# Patient Record
Sex: Female | Born: 1990
Health system: Southern US, Community
[De-identification: ages and names within clinical notes are randomized; demographics above are authoritative.]

## PROBLEM LIST (undated history)

## (undated) ENCOUNTER — Inpatient Hospital Stay (HOSPITAL_COMMUNITY): Payer: Self-pay

## (undated) DIAGNOSIS — F32A Depression, unspecified: Secondary | ICD-10-CM

## (undated) DIAGNOSIS — O039 Complete or unspecified spontaneous abortion without complication: Secondary | ICD-10-CM

## (undated) HISTORY — DX: Depression, unspecified: F32.A

## (undated) HISTORY — PX: OTHER SURGICAL HISTORY: SHX169

---
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
## 2008-07-05 ENCOUNTER — Emergency Department (HOSPITAL_COMMUNITY): Admission: EM | Admit: 2008-07-05 | Discharge: 2008-07-06 | Payer: Self-pay | Admitting: Emergency Medicine

## 2010-01-12 ENCOUNTER — Inpatient Hospital Stay (HOSPITAL_COMMUNITY): Admission: AD | Admit: 2010-01-12 | Discharge: 2010-01-12 | Payer: Self-pay | Admitting: Obstetrics and Gynecology

## 2010-01-12 DIAGNOSIS — O368190 Decreased fetal movements, unspecified trimester, not applicable or unspecified: Secondary | ICD-10-CM

## 2010-02-12 ENCOUNTER — Inpatient Hospital Stay (HOSPITAL_COMMUNITY): Admission: AD | Admit: 2010-02-12 | Discharge: 2010-02-12 | Payer: Self-pay | Admitting: Obstetrics & Gynecology

## 2010-02-13 ENCOUNTER — Inpatient Hospital Stay (HOSPITAL_COMMUNITY): Admission: AD | Admit: 2010-02-13 | Discharge: 2010-02-15 | Payer: Self-pay | Admitting: Obstetrics and Gynecology

## 2010-04-28 ENCOUNTER — Ambulatory Visit (HOSPITAL_COMMUNITY)
Admission: RE | Admit: 2010-04-28 | Discharge: 2010-04-28 | Disposition: A | Discharge status: Home / Self Care | Payer: 59 | Source: Ambulatory Visit | Attending: Obstetrics and Gynecology | Admitting: Obstetrics and Gynecology

## 2010-04-28 DIAGNOSIS — O923 Agalactia: Secondary | ICD-10-CM | POA: Insufficient documentation

## 2010-05-30 LAB — CBC
HCT: 28.2 % — ABNORMAL LOW (ref 36.0–46.0)
HCT: 33.5 % — ABNORMAL LOW (ref 36.0–46.0)
MCH: 29.4 pg (ref 26.0–34.0)
MCHC: 33.3 g/dL (ref 30.0–36.0)
MCV: 85.4 fL (ref 78.0–100.0)
MCV: 85.8 fL (ref 78.0–100.0)
Platelets: 285 10*3/uL (ref 150–400)
RBC: 3.29 MIL/uL — ABNORMAL LOW (ref 3.87–5.11)
RDW: 13.8 % (ref 11.5–15.5)
WBC: 12.4 10*3/uL — ABNORMAL HIGH (ref 4.0–10.5)

## 2010-06-28 LAB — CBC
Hemoglobin: 13.4 g/dL (ref 12.0–15.0)
MCHC: 34.4 g/dL (ref 30.0–36.0)
MCV: 86.6 fL (ref 78.0–100.0)
RDW: 12.6 % (ref 11.5–15.5)

## 2010-06-28 LAB — DIFFERENTIAL
Basophils Absolute: 0 10*3/uL (ref 0.0–0.1)
Basophils Relative: 1 % (ref 0–1)
Eosinophils Absolute: 0.2 10*3/uL (ref 0.0–0.7)
Eosinophils Relative: 2 % (ref 0–5)
Monocytes Absolute: 1.1 10*3/uL — ABNORMAL HIGH (ref 0.1–1.0)
Neutro Abs: 5.4 10*3/uL (ref 1.7–7.7)

## 2010-06-28 LAB — POCT I-STAT, CHEM 8
BUN: 7 mg/dL (ref 6–23)
Calcium, Ion: 1.3 mmol/L (ref 1.12–1.32)
Creatinine, Ser: 0.8 mg/dL (ref 0.4–1.2)
Glucose, Bld: 97 mg/dL (ref 70–99)
Hemoglobin: 14.3 g/dL (ref 12.0–15.0)
Sodium: 141 mEq/L (ref 135–145)
TCO2: 27 mmol/L (ref 0–100)

## 2011-03-31 ENCOUNTER — Encounter (HOSPITAL_COMMUNITY): Payer: Self-pay

## 2011-03-31 ENCOUNTER — Inpatient Hospital Stay (HOSPITAL_COMMUNITY)
Admission: AD | Admit: 2011-03-31 | Discharge: 2011-03-31 | Disposition: A | Discharge status: Home / Self Care | Payer: Medicaid Other | Source: Ambulatory Visit | Attending: Obstetrics & Gynecology | Admitting: Obstetrics & Gynecology

## 2011-03-31 DIAGNOSIS — R109 Unspecified abdominal pain: Secondary | ICD-10-CM | POA: Insufficient documentation

## 2011-03-31 DIAGNOSIS — O99891 Other specified diseases and conditions complicating pregnancy: Secondary | ICD-10-CM | POA: Insufficient documentation

## 2011-03-31 DIAGNOSIS — T148XXA Other injury of unspecified body region, initial encounter: Secondary | ICD-10-CM

## 2011-03-31 DIAGNOSIS — R10817 Generalized abdominal tenderness: Secondary | ICD-10-CM

## 2011-03-31 LAB — URINALYSIS, ROUTINE W REFLEX MICROSCOPIC
Bilirubin Urine: NEGATIVE
Glucose, UA: NEGATIVE mg/dL
Hgb urine dipstick: NEGATIVE
Protein, ur: NEGATIVE mg/dL
Urobilinogen, UA: 2 mg/dL — ABNORMAL HIGH (ref 0.0–1.0)

## 2011-03-31 NOTE — ED Provider Notes (Signed)
History   Pt presents today c/o lower abd pain that began immediately after she picked up her 30lbs son. She states the pain felt like it encompassed the entire lower abd but has progressively improved. She denies vag dc, bleeding, fever, or any other sx at this time.   Chief Complaint  Patient presents with  . Abdominal Pain   HPI  OB History    Grav Para Term Preterm Abortions TAB SAB Ect Mult Living   2 1 1  0 0 0 0 0 0 1      Past Medical History  Diagnosis Date  . Asthma     Past Surgical History  Procedure Date  . No past surgeries     No family history on file.  History  Substance Use Topics  . Smoking status: Never Smoker   . Smokeless tobacco: Not on file  . Alcohol Use: No    Allergies: Allergies not on file  No prescriptions prior to admission    Review of Systems  Constitutional: Negative for fever.  Eyes: Negative for blurred vision and double vision.  Cardiovascular: Negative for chest pain and palpitations.  Gastrointestinal: Positive for abdominal pain. Negative for nausea, vomiting, diarrhea and constipation.  Genitourinary: Negative for dysuria, urgency and frequency.  Neurological: Negative for dizziness and headaches.  Psychiatric/Behavioral: Negative for depression and suicidal ideas.   Physical Exam   Blood pressure 105/53, pulse 90, temperature 98.8 F (37.1 C), temperature source Oral, resp. rate 16, height 5\' 4"  (1.626 m), weight 161 lb (73.029 kg).  Physical Exam  Nursing note and vitals reviewed. Constitutional: She is oriented to person, place, and time. She appears well-developed and well-nourished. No distress.  HENT:  Head: Normocephalic and atraumatic.  Eyes: EOM are normal. Pupils are equal, round, and reactive to light.  GI: Soft. She exhibits no distension and no mass. There is no tenderness. There is no rebound and no guarding.  Genitourinary: No bleeding around the vagina. No vaginal discharge found.       Cervix  Lg/closed. FHTs 135 by doppler.  Neurological: She is alert and oriented to person, place, and time.  Skin: Skin is warm and dry. She is not diaphoretic.  Psychiatric: She has a normal mood and affect. Her behavior is normal. Judgment and thought content normal.    MAU Course  Procedures  UA pending.  Assessment and Plan  Abd pain: pt pain now improving. Most likely muscle strain from lifting her son. Will call pt is ua is positive for UTI. Otherwise, pt is to f/u with OB provider of her choice. Discussed diet, activity, risks, and precautions.  Clinton Gallant. Amali Uhls III, DrHSc, MPAS, PA-C  03/31/2011, 3:19 PM   Henrietta Hoover, PA 03/31/11 1529

## 2011-03-31 NOTE — Progress Notes (Signed)
Patient presents with severe lower abdominal pain after picking up her one year old, has gotten worse, no vaginal bleeding.

## 2011-04-26 ENCOUNTER — Encounter (HOSPITAL_COMMUNITY): Payer: Self-pay

## 2011-04-26 ENCOUNTER — Inpatient Hospital Stay (HOSPITAL_COMMUNITY)
Admission: AD | Admit: 2011-04-26 | Discharge: 2011-04-26 | Disposition: A | Discharge status: Home / Self Care | Payer: Medicaid Other | Source: Ambulatory Visit | Attending: Obstetrics & Gynecology | Admitting: Obstetrics & Gynecology

## 2011-04-26 DIAGNOSIS — G44209 Tension-type headache, unspecified, not intractable: Secondary | ICD-10-CM | POA: Insufficient documentation

## 2011-04-26 DIAGNOSIS — N949 Unspecified condition associated with female genital organs and menstrual cycle: Secondary | ICD-10-CM

## 2011-04-26 DIAGNOSIS — R109 Unspecified abdominal pain: Secondary | ICD-10-CM | POA: Insufficient documentation

## 2011-04-26 DIAGNOSIS — O99891 Other specified diseases and conditions complicating pregnancy: Secondary | ICD-10-CM | POA: Insufficient documentation

## 2011-04-26 LAB — URINALYSIS, ROUTINE W REFLEX MICROSCOPIC
Bilirubin Urine: NEGATIVE
Glucose, UA: NEGATIVE mg/dL
Ketones, ur: 40 mg/dL — AB
Leukocytes, UA: NEGATIVE
Nitrite: NEGATIVE
Specific Gravity, Urine: 1.015 (ref 1.005–1.030)
pH: 7 (ref 5.0–8.0)

## 2011-04-26 LAB — WET PREP, GENITAL
Clue Cells Wet Prep HPF POC: NONE SEEN
Trich, Wet Prep: NONE SEEN

## 2011-04-26 MED ORDER — PRENATAL RX 60-1 MG PO TABS: 1.0000 | ORAL_TABLET | Freq: Every day | ORAL | Status: DC

## 2011-04-26 NOTE — ED Provider Notes (Signed)
Chief Complaint  Patient presents with  . Abdominal Pain    S: Erin [name] is a 21 y.o. G2P1001 at unknown GA with LMP "November" presenting with 3 day hx of sharp crampy pain in lower abdomen and groin. The pain is exacerbated by walking and changing positions. No dysuria, urgency or frequency. She denies contractions, vaginal bleeding or leakage of fluid. Fetus is active. Having frequent moderate frontal headaches, not treated.  She intends to have a mid trimester pregnancy termination at [facility] in [place] when her Medicaid comes through. 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   ROS: Negative except as noted above.  O: Filed Vitals:   04/26/11 1224  BP: 110/73  Pulse: 104  Temp: 98.9 F (37.2 C)  Resp: 16    Gen: NAD Abd: soft, mildly tender in lower abd and groin. Fundus at [id number] Pelvic: NEFG, BUS neg             Spec: thick white discharge             Cx: L/C/H  Uterus: 16 wk size DT FHR 140 UCs: none  Results for orders placed during the hospital encounter of 04/26/11 (from the past 24 hour(s))  URINALYSIS, ROUTINE W REFLEX MICROSCOPIC     Status: Abnormal   Collection Time   04/26/11 12:35 PM      Component Value Range   Color, Urine YELLOW  YELLOW    APPearance HAZY (*) CLEAR    Specific Gravity, Urine 1.015  1.005 - 1.030    pH 7.0  5.0 - 8.0    Glucose, UA NEGATIVE  NEGATIVE (mg/dL)   Hgb urine dipstick NEGATIVE  NEGATIVE    Bilirubin Urine NEGATIVE  NEGATIVE    Ketones, ur 40 (*) NEGATIVE (mg/dL)   Protein, ur NEGATIVE  NEGATIVE (mg/dL)   Urobilinogen, UA 2.0 (*) 0.0 - 1.0 (mg/dL)   Nitrite NEGATIVE  NEGATIVE    Leukocytes, UA NEGATIVE  NEGATIVE    Results for orders placed during the hospital encounter of 04/26/11 (from the past 24 hour(s))  URINALYSIS, ROUTINE W REFLEX MICROSCOPIC     Status: Abnormal   Collection Time   04/26/11 12:35 PM      Component Value Range   Color, Urine YELLOW  YELLOW    APPearance HAZY (*) CLEAR    Specific  Gravity, Urine 1.015  1.005 - 1.030    pH 7.0  5.0 - 8.0    Glucose, UA NEGATIVE  NEGATIVE (mg/dL)   Hgb urine dipstick NEGATIVE  NEGATIVE    Bilirubin Urine NEGATIVE  NEGATIVE    Ketones, ur 40 (*) NEGATIVE (mg/dL)   Protein, ur NEGATIVE  NEGATIVE (mg/dL)   Urobilinogen, UA 2.0 (*) 0.0 - 1.0 (mg/dL)   Nitrite NEGATIVE  NEGATIVE    Leukocytes, UA NEGATIVE  NEGATIVE   WET PREP, GENITAL     Status: Abnormal   Collection Time   04/26/11  1:00 PM      Component Value Range   Yeast Wet Prep HPF POC NONE SEEN  NONE SEEN    Trich, Wet Prep NONE SEEN  NONE SEEN    Clue Cells Wet Prep HPF POC NONE SEEN  NONE SEEN    WBC, Wet Prep HPF POC FEW (*) NONE SEEN    A: Round Ligament Pain @ 16-[redacted] wk GA Tension H/A  P: GC/CT sent.  Tylenol for H/A  Reassurance given and general relief measures reviewed: avoidance of precipitating movements,  instructions on abdominal tightening/pelvic rock exercises, abdominal binder, rest with hip flexion. PNVs given in case she opts to continue the pregnancy and go to [facility].

## 2011-04-26 NOTE — Progress Notes (Signed)
Patient states that since the first of the week she has been having headaches, low back pain and lower abdominal cramping. Has had no prenatal care and is unsure if she is going to keep this pregnancy. Obtained FHR in triage. Denies any bleeding or unusual discharge.

## 2011-04-26 NOTE — ED Provider Notes (Signed)
Attestation of Attending Supervision of Advanced Practitioner: Evaluation and management procedures were performed by the PA/NP/CNM/OB Fellow under my supervision/collaboration. Chart reviewed, and agree with management and plan.  Suriah Peragine, M.D. 04/26/2011 2:09 PM   

## 2011-04-27 LAB — GC/CHLAMYDIA PROBE AMP, GENITAL: Chlamydia, DNA Probe: NEGATIVE

## 2011-06-20 ENCOUNTER — Encounter (HOSPITAL_COMMUNITY): Payer: Self-pay | Admitting: *Deleted

## 2011-06-20 ENCOUNTER — Inpatient Hospital Stay (HOSPITAL_COMMUNITY)
Admission: AD | Admit: 2011-06-20 | Discharge: 2011-06-20 | Disposition: A | Discharge status: Home / Self Care | Payer: Medicaid Other | Source: Ambulatory Visit | Attending: Obstetrics & Gynecology | Admitting: Obstetrics & Gynecology

## 2011-06-20 DIAGNOSIS — N946 Dysmenorrhea, unspecified: Secondary | ICD-10-CM | POA: Insufficient documentation

## 2011-06-20 DIAGNOSIS — N949 Unspecified condition associated with female genital organs and menstrual cycle: Secondary | ICD-10-CM | POA: Insufficient documentation

## 2011-06-20 DIAGNOSIS — N898 Other specified noninflammatory disorders of vagina: Secondary | ICD-10-CM

## 2011-06-20 DIAGNOSIS — R109 Unspecified abdominal pain: Secondary | ICD-10-CM | POA: Insufficient documentation

## 2011-06-20 LAB — WET PREP, GENITAL
Clue Cells Wet Prep HPF POC: NONE SEEN
Yeast Wet Prep HPF POC: NONE SEEN

## 2011-06-20 LAB — URINALYSIS, ROUTINE W REFLEX MICROSCOPIC
Bilirubin Urine: NEGATIVE
Glucose, UA: NEGATIVE mg/dL
Ketones, ur: NEGATIVE mg/dL
Leukocytes, UA: NEGATIVE
Protein, ur: NEGATIVE mg/dL
pH: 6 (ref 5.0–8.0)

## 2011-06-20 LAB — URINE MICROSCOPIC-ADD ON

## 2011-06-20 NOTE — Discharge Instructions (Signed)
Folllow up with a regular gynecology provider if your symptoms worsen or do not improve. Today everything looks normal.

## 2011-06-20 NOTE — MAU Provider Note (Signed)
History     CSN: [id number]  Arrival date and time: 06/20/11 2112   First Provider Initiated Contact with Patient 06/20/11 2217      Chief Complaint  Patient presents with  . Dysmenorrhea   HPI 21 y.o. G2P1011 with brown discharge and cramping. Patient's last menstrual period was 06/10/2011. Brown discharge since end of menses 4 days ago.    Past Medical History  Diagnosis Date  . Asthma     Past Surgical History  Procedure Date  . Tab     Family History  Problem Relation Age of Onset  . Anesthesia problems Neg Hx   . Hypotension Neg Hx   . Malignant hyperthermia Neg Hx   . Pseudochol deficiency Neg Hx   . Hyperlipidemia Father   . Hypertension Father   . Cancer Paternal Aunt   . Cancer Maternal Grandfather     History  Substance Use Topics  . Smoking status: Never Smoker   . Smokeless tobacco: Not on file  . Alcohol Use: No    Allergies: No Known Allergies  Prescriptions prior to admission  Medication Sig Dispense Refill  . cetirizine (ZYRTEC) 10 MG tablet Take 10 mg by mouth daily as needed. For allergies      . albuterol (PROVENTIL HFA;VENTOLIN HFA) 108 (90 BASE) MCG/ACT inhaler Inhale 2 puffs into the lungs every 6 (six) hours as needed. For asthma attacks/wheezing        Review of Systems  Constitutional: Negative.   Respiratory: Negative.   Cardiovascular: Negative.   Gastrointestinal: Positive for abdominal pain. Negative for nausea, vomiting, diarrhea and constipation.  Genitourinary: Negative for dysuria, urgency, frequency, hematuria and flank pain.       Positive for vaginal discharge  Musculoskeletal: Negative.   Neurological: Negative.   Psychiatric/Behavioral: Negative.    Physical Exam   Last menstrual period 06/10/2011, unknown if currently breastfeeding.  Physical Exam  Nursing note and vitals reviewed. Constitutional: She is oriented to person, place, and time. She appears well-developed and well-nourished. No distress.  HENT:    Head: Normocephalic and atraumatic.  Cardiovascular: Normal rate, regular rhythm and normal heart sounds.   Respiratory: Effort normal and breath sounds normal. No respiratory distress.  GI: Soft. Bowel sounds are normal. She exhibits no distension and no mass. There is no tenderness. There is no rebound and no guarding.  Genitourinary: There is no rash or lesion on the right labia. There is no rash or lesion on the left labia. Uterus is not deviated, not enlarged, not fixed and not tender. Cervix exhibits no motion tenderness, no discharge and no friability. Right adnexum displays no mass, no tenderness and no fullness. Left adnexum displays no mass, no tenderness and no fullness. No erythema, tenderness or bleeding around the vagina. Vaginal discharge (small, brown) found.  Neurological: She is alert and oriented to person, place, and time.  Skin: Skin is warm and dry.  Psychiatric: She has a normal mood and affect.    MAU Course  Procedures Results for orders placed during the hospital encounter of 06/20/11 (from the past 24 hour(s))  URINALYSIS, ROUTINE W REFLEX MICROSCOPIC     Status: Abnormal   Collection Time   06/20/11  9:30 PM      Component Value Range   Color, Urine YELLOW  YELLOW    APPearance CLEAR  CLEAR    Specific Gravity, Urine >1.030 (*) 1.005 - 1.030    pH 6.0  5.0 - 8.0    Glucose, UA NEGATIVE  NEGATIVE (mg/dL)   Hgb urine dipstick LARGE (*) NEGATIVE    Bilirubin Urine NEGATIVE  NEGATIVE    Ketones, ur NEGATIVE  NEGATIVE (mg/dL)   Protein, ur NEGATIVE  NEGATIVE (mg/dL)   Urobilinogen, UA 0.2  0.0 - 1.0 (mg/dL)   Nitrite NEGATIVE  NEGATIVE    Leukocytes, UA NEGATIVE  NEGATIVE   URINE MICROSCOPIC-ADD ON     Status: Abnormal   Collection Time   06/20/11  9:30 PM      Component Value Range   Squamous Epithelial / LPF MANY (*) RARE    WBC, UA 3-6  <3 (WBC/hpf)   RBC / HPF 7-10  <3 (RBC/hpf)   Bacteria, UA MANY (*) RARE    Urine-Other MUCOUS PRESENT    POCT  PREGNANCY, URINE     Status: Normal   Collection Time   06/20/11  9:35 PM      Component Value Range   Preg Test, Ur NEGATIVE  NEGATIVE   WET PREP, GENITAL     Status: Abnormal   Collection Time   06/20/11 10:20 PM      Component Value Range   Yeast Wet Prep HPF POC NONE SEEN  NONE SEEN    Trich, Wet Prep NONE SEEN  NONE SEEN    Clue Cells Wet Prep HPF POC NONE SEEN  NONE SEEN    WBC, Wet Prep HPF POC FEW (*) NONE SEEN      Assessment and Plan  21 y.o. [id number] with vaginal discharge and cramping, normal exam F/U outpatient if symptoms worsen  Chanc Kervin 06/20/2011, 10:25 PM

## 2011-06-20 NOTE — MAU Note (Signed)
N. Frazier, CNM at bedside.  Assessment done and poc discussed with pt.  

## 2011-06-20 NOTE — MAU Note (Signed)
Pt reports brown mucus discharge, and  Cramping. States she had a termination in January and had a normal period in February and a period that started on 06/10/2011

## 2011-06-20 NOTE — Progress Notes (Signed)
SSE done per CNM.  Wet prep and cultures collected.  VE done.  

## 2011-06-21 LAB — GC/CHLAMYDIA PROBE AMP, GENITAL: Chlamydia, DNA Probe: NEGATIVE

## 2011-08-10 ENCOUNTER — Encounter (HOSPITAL_COMMUNITY): Payer: Self-pay

## 2011-08-10 ENCOUNTER — Inpatient Hospital Stay (HOSPITAL_COMMUNITY)
Admission: AD | Admit: 2011-08-10 | Discharge: 2011-08-10 | Disposition: A | Discharge status: Home / Self Care | Payer: Medicaid Other | Source: Ambulatory Visit | Attending: Obstetrics & Gynecology | Admitting: Obstetrics & Gynecology

## 2011-08-10 ENCOUNTER — Inpatient Hospital Stay (HOSPITAL_COMMUNITY): Payer: Medicaid Other

## 2011-08-10 DIAGNOSIS — O209 Hemorrhage in early pregnancy, unspecified: Secondary | ICD-10-CM

## 2011-08-10 DIAGNOSIS — O200 Threatened abortion: Secondary | ICD-10-CM | POA: Insufficient documentation

## 2011-08-10 LAB — URINE MICROSCOPIC-ADD ON

## 2011-08-10 LAB — HCG, QUANTITATIVE, PREGNANCY: hCG, Beta Chain, Quant, S: 1221 m[IU]/mL — ABNORMAL HIGH (ref <5)

## 2011-08-10 LAB — CBC
MCH: 27.6 pg (ref 26.0–34.0)
MCHC: 32.8 g/dL (ref 30.0–36.0)
Platelets: 332 10*3/uL (ref 150–400)
RDW: 13.1 % (ref 11.5–15.5)

## 2011-08-10 LAB — URINALYSIS, ROUTINE W REFLEX MICROSCOPIC
Bilirubin Urine: NEGATIVE
Glucose, UA: NEGATIVE mg/dL
Ketones, ur: NEGATIVE mg/dL
Nitrite: NEGATIVE
Specific Gravity, Urine: >1.03 — ABNORMAL HIGH (ref 1.005–1.030)
pH: 6 (ref 5.0–8.0)

## 2011-08-10 LAB — ABO/RH: ABO/RH(D): O POS

## 2011-08-10 LAB — POCT PREGNANCY, URINE: Preg Test, Ur: POSITIVE — AB

## 2011-08-10 NOTE — MAU Note (Signed)
Cramping started a little while ago then noticed vaginal bleeding light like the first day of your period, positive home pregnancy test. LMP 05/29/11

## 2011-08-10 NOTE — MAU Provider Note (Signed)
History     CSN: [id number]  Arrival date and time: 08/10/11 1823   Client seen at 2040    Chief Complaint  Patient presents with  . Vaginal Bleeding  . Abdominal Cramping   HPI Erin [name] 20 y.o. [redacted]w[redacted]d Comes to MAU with light vaginal bleeding.  Was seen in MAU in early April and cultures were negative at that time.  Same partner.  OB History    Grav Para Term Preterm Abortions TAB SAB Ect Mult Living   3 1 1  0 1 1 0 0 0 1      Past Medical History  Diagnosis Date  . Asthma     Past Surgical History  Procedure Date  . Tab   . Dilation and curettage of uterus     Family History  Problem Relation Age of Onset  . Anesthesia problems Neg Hx   . Hypotension Neg Hx   . Malignant hyperthermia Neg Hx   . Pseudochol deficiency Neg Hx   . Hyperlipidemia Father   . Hypertension Father   . Cancer Paternal Aunt   . Cancer Maternal Grandfather     History  Substance Use Topics  . Smoking status: Never Smoker   . Smokeless tobacco: Not on file  . Alcohol Use: No    Allergies: No Known Allergies  Prescriptions prior to admission  Medication Sig Dispense Refill  . albuterol (PROVENTIL HFA;VENTOLIN HFA) 108 (90 BASE) MCG/ACT inhaler Inhale 2 puffs into the lungs every 6 (six) hours as needed. For asthma attacks/wheezing      . cetirizine (ZYRTEC) 10 MG tablet Take 10 mg by mouth daily as needed. For allergies        Review of Systems  Gastrointestinal: Negative for nausea, vomiting and abdominal pain.  Genitourinary:       Vaginal bleeding   Physical Exam   Blood pressure 121/62, pulse 88, temperature 99.5 F (37.5 C), resp. rate 16, height 5' 3.5" (1.613 m), weight 166 lb 3.2 oz (75.388 kg), last menstrual period 05/29/2011.  Physical Exam  Nursing note and vitals reviewed. Constitutional: She is oriented to person, place, and time. She appears well-developed and well-nourished.  HENT:  Head: Normocephalic.  Eyes: EOM are normal.  Neck: Neck supple.    Musculoskeletal: Normal range of motion.  Neurological: She is alert and oriented to person, place, and time.  Skin: Skin is warm and dry.  Psychiatric: She has a normal mood and affect.    MAU Course  Procedures  MDM Clinical Data: Bleeding/cramping, positive pregnancy test  OBSTETRIC <14 WK [place] AND TRANSVAGINAL OB US  Technique: Both transabdominal and transvaginal ultrasound  examinations were performed for complete evaluation of the  gestation as well as the maternal uterus, adnexal regions, and  pelvic cul-de-sac. Transvaginal technique was performed to assess  early pregnancy.  Comparison: None.  Intrauterine gestational sac: Visualized, although with mildly  irregular shape and apparent sliding within the endometrial cavity  on real time sonographic imaging.  Yolk sac: Present.  Embryo: Present.  Cardiac Activity: Present, although at the lower limits of normal.  Heart Rate: 90 bpm  CRL: 2.8 mm, with estimated gestational age [redacted] weeks 0 days by crown-  rump length.  [place] EDC: 04/04/2012  Maternal uterus/adnexae:  No subchorionic hemorrhage.  Left ovary measures 3.6 x 1.9 x 2.0 cm. Right ovary measures 2.6 x  2.2 x 1.8 cm.  No free fluid.  IMPRESSION:  Single live intrauterine gestation with estimated gestational age [redacted]  weeks 0 days by crown-rump length.  Fetal heart rate at the lower limits of normal, 90 bpm. Consider  follow-up ultrasound in 7-10 days or as clinically warranted.    Assessment and Plan  Threatened miscarriage  Plan Keep the prenatal care appointment you have June 3 with [facility] May have a miscarriage - reviewed ultrasound and reviewed possible miscarriage Return with severe bleeding or cramping.  Erin [name] 08/10/2011, 8:40 PM

## 2011-08-10 NOTE — Discharge Instructions (Signed)
Keep your prenatal care appointment. Return if you have severe pain or heavy bleeding where you are becoming faint.

## 2011-08-15 ENCOUNTER — Inpatient Hospital Stay (HOSPITAL_COMMUNITY): Payer: Medicaid Other

## 2011-08-15 ENCOUNTER — Encounter (HOSPITAL_COMMUNITY): Payer: Self-pay

## 2011-08-15 ENCOUNTER — Inpatient Hospital Stay (HOSPITAL_COMMUNITY)
Admission: AD | Admit: 2011-08-15 | Discharge: 2011-08-15 | Disposition: A | Discharge status: Home / Self Care | Payer: Medicaid Other | Source: Ambulatory Visit | Attending: Obstetrics & Gynecology | Admitting: Obstetrics & Gynecology

## 2011-08-15 DIAGNOSIS — O039 Complete or unspecified spontaneous abortion without complication: Secondary | ICD-10-CM

## 2011-08-15 LAB — CBC
Hemoglobin: 11.6 g/dL — ABNORMAL LOW (ref 12.0–15.0)
MCH: 28.2 pg (ref 26.0–34.0)
MCHC: 33 g/dL (ref 30.0–36.0)
RDW: 12.9 % (ref 11.5–15.5)

## 2011-08-15 LAB — HCG, QUANTITATIVE, PREGNANCY: hCG, Beta Chain, Quant, S: 32 m[IU]/mL — ABNORMAL HIGH (ref <5)

## 2011-08-15 NOTE — MAU Note (Signed)
Pt states started bleeding Friday, heavy per pt. Feeling intermittent sharp cramping, rates 7/10 at present.

## 2011-08-15 NOTE — MAU Provider Note (Signed)
History     CSN: [id number]  Arrival date & time 08/15/11  1200   None     Chief Complaint  Patient presents with  . Abdominal Pain  . Vaginal Bleeding   HPI Erin [name] is a 21 y.o. female @ [redacted]w[redacted]d gestation who return to MAU for heavy vaginal bleeding and low abdominal cramping. She was evaluated 08/10/11 for bleeding and had an ultrasound that showed an IUGS. MAU provider told the patient at that time this may be a failed pregnancy. She has her first appointment with Dr. Dareen Piano 08/20/11. The history was provided by the patient and her medical record.  Past Medical History  Diagnosis Date  . Asthma     Past Surgical History  Procedure Date  . Tab   . Dilation and curettage of uterus     Family History  Problem Relation Age of Onset  . Anesthesia problems Neg Hx   . Hypotension Neg Hx   . Malignant hyperthermia Neg Hx   . Pseudochol deficiency Neg Hx   . Hyperlipidemia Father   . Hypertension Father   . Cancer Paternal Aunt   . Cancer Maternal Grandfather     History  Substance Use Topics  . Smoking status: Never Smoker   . Smokeless tobacco: Not on file  . Alcohol Use: No    OB History    Grav Para Term Preterm Abortions TAB SAB Ect Mult Living   3 1 1  0 1 1 0 0 0 1      Review of Systems  Constitutional: Negative for fever, chills and appetite change.  HENT: Negative.   Eyes: Negative.   Respiratory: Negative.   Gastrointestinal: Positive for abdominal pain (cramping). Negative for nausea, vomiting, diarrhea and constipation.  Genitourinary: Positive for frequency and vaginal bleeding. Negative for dysuria and urgency.  Musculoskeletal: Positive for back pain.  Neurological: Negative for dizziness and headaches.  Psychiatric/Behavioral: Negative for confusion and agitation.    Allergies  Review of patient's allergies indicates no known allergies.  Home Medications  No current outpatient prescriptions on file.  BP 106/60  Pulse 79  Temp(Src) 98 F  (36.7 C) (Oral)  Resp 18  Ht 5\' 3"  (1.6 m)  Wt 158 lb 12.8 oz (72.031 kg)  BMI 28.13 kg/m2  SpO2 96%  LMP 05/29/2011  Physical Exam  Nursing note and vitals reviewed. Constitutional: She is oriented to person, place, and time. She appears well-developed and well-nourished. No distress.  HENT:  Head: Normocephalic.  Eyes: EOM are normal.  Neck: Neck supple.  Cardiovascular: Normal rate.   Pulmonary/Chest: Effort normal.  Abdominal: Soft.       Mildly tender lower abdomen with palpation. No rebound or guarding.  Genitourinary:       External genitalia without lesions. Small blood vaginal vault. Mild CMT, no adnexal tenderness, uterus without palpable enlargement.  Musculoskeletal: Normal range of motion.  Neurological: She is alert and oriented to person, place, and time. No cranial nerve deficit.  Skin: Skin is warm and dry.  Psychiatric: She has a normal mood and affect. Her behavior is normal. Judgment and thought content normal.    ED Course  Procedures  Ultrasound today shows no IUGS as identified on last ultrasound. Consistent with SAB.   When patient returned from ultrasound bleeding had slowed considerably and she is having minimal pain.  Results for orders placed during the hospital encounter of 08/15/11 (from the past 24 hour(s))  CBC     Status: Abnormal  Collection Time   08/15/11  2:30 PM      Component Value Range   WBC 7.1  4.0 - 10.5 (K/uL)   RBC 4.12  3.87 - 5.11 (MIL/uL)   Hemoglobin 11.6 (*) 12.0 - 15.0 (g/dL)   HCT 40.9 (*) 81.1 - 46.0 (%)   MCV 85.2  78.0 - 100.0 (fL)   MCH 28.2  26.0 - 34.0 (pg)   MCHC 33.0  30.0 - 36.0 (g/dL)   RDW 91.4  78.2 - 95.6 (%)   Platelets 347  150 - 400 (K/uL)   Blood type is O positive  Assessment:  Conplete SAB  Plan:  Follow up with Dr. Dareen Piano as scheduled   Return here as needed   Comfort packet given I have reviewed this patient's vital signs, nurses notes, appropriate labs and imaging.  MDM

## 2011-08-15 NOTE — MAU Note (Signed)
Patient states she has had spotting this am, 2x4 cm bloody area on the pad patient is wearing.

## 2011-08-15 NOTE — Discharge Instructions (Signed)
KEEP YOUR FOLLOW UP APPOINTMENT WITH DR. ANDERSON. TAKE IBUPROFEN IF NEEDED FOR CRAMPING. RETURN HERE AS NEEDED FOR PROBLEMS.

## 2011-08-15 NOTE — Progress Notes (Signed)
Speculum Exam performed by H.Neese NP small amount of bleeding noted

## 2011-09-18 ENCOUNTER — Encounter (HOSPITAL_COMMUNITY): Payer: Self-pay

## 2011-09-18 ENCOUNTER — Inpatient Hospital Stay (HOSPITAL_COMMUNITY)
Admission: AD | Admit: 2011-09-18 | Discharge: 2011-09-18 | Disposition: A | Discharge status: Home / Self Care | Payer: Medicaid Other | Source: Ambulatory Visit | Attending: Obstetrics and Gynecology | Admitting: Obstetrics and Gynecology

## 2011-09-18 DIAGNOSIS — R102 Pelvic and perineal pain: Secondary | ICD-10-CM

## 2011-09-18 DIAGNOSIS — R109 Unspecified abdominal pain: Secondary | ICD-10-CM | POA: Insufficient documentation

## 2011-09-18 DIAGNOSIS — M549 Dorsalgia, unspecified: Secondary | ICD-10-CM | POA: Insufficient documentation

## 2011-09-18 HISTORY — DX: Complete or unspecified spontaneous abortion without complication: O03.9

## 2011-09-18 LAB — URINALYSIS, ROUTINE W REFLEX MICROSCOPIC
Bilirubin Urine: NEGATIVE
Glucose, UA: NEGATIVE mg/dL
Ketones, ur: NEGATIVE mg/dL
Leukocytes, UA: NEGATIVE
Nitrite: NEGATIVE
Protein, ur: NEGATIVE mg/dL
pH: 6 (ref 5.0–8.0)

## 2011-09-18 LAB — WET PREP, GENITAL
Clue Cells Wet Prep HPF POC: NONE SEEN
Yeast Wet Prep HPF POC: NONE SEEN

## 2011-09-18 NOTE — MAU Provider Note (Signed)
History     CSN: [id number]  Arrival date and time: 09/18/11 5621   First Provider Initiated Contact with Patient 09/18/11 1028      Chief Complaint  Patient presents with  . Abdominal Pain  . Back Pain    HPI  21 yo [id number] with SAB 1 month ago who presents with lower abdominal pain and low back pain X 4 days. Back pain has been constant since waking up 4 days ago. Pt states she did sleep on a new mattress. She describes it as "band-like" and worse on the R side. It is not made worse with movement. Abdominal pain is suprapubic and intermittent. She endorses mild nausea, no vomiting; denies diarrhea, no BRBPR or melena. No fever, one episode of chills yesterday. She complains of anorexia. No sick contacts, no recent illnesses. She denies urinary symptoms. She denies abnormal vaginal discharge; no odor, burning, or itching. She has not had any vaginal bleeding since SAB. She is currently using Nexplanon which was placed approximately 1.5 weeks ago according to pt. Pt took a UPT last week (09/13/11).  Pertinent Gynecological History: Menses: pt has not had period since SAB at end of May Contraception: Nexplanon Sexually transmitted diseases: denies recent exposure    Past Medical History  Diagnosis Date  . Asthma   . Miscarriage     Past Surgical History  Procedure Date  . Tab   . Dilation and curettage of uterus     Family History  Problem Relation Age of Onset  . Anesthesia problems Neg Hx   . Hypotension Neg Hx   . Malignant hyperthermia Neg Hx   . Pseudochol deficiency Neg Hx   . Hyperlipidemia Father   . Hypertension Father   . Cancer Paternal Aunt   . Cancer Maternal Grandfather     History  Substance Use Topics  . Smoking status: Never Smoker   . Smokeless tobacco: Not on file  . Alcohol Use: No    Allergies: No Known Allergies  Prescriptions prior to admission  Medication Sig Dispense Refill  . albuterol (PROVENTIL HFA;VENTOLIN HFA) 108 (90 BASE)  MCG/ACT inhaler Inhale 2 puffs into the lungs every 6 (six) hours as needed. For asthma attacks/wheezing      . cetirizine (ZYRTEC) 10 MG tablet Take 10 mg by mouth daily as needed. For allergies        Review of Systems  Constitutional: Positive for chills. Negative for fever and malaise/fatigue.  HENT: Negative for congestion and sore throat.   Respiratory: Negative for cough and shortness of breath.   Gastrointestinal: Positive for nausea and abdominal pain. Negative for vomiting, diarrhea, blood in stool and melena.  Genitourinary: Negative for dysuria, urgency, frequency and hematuria.  Musculoskeletal: Positive for back pain.  Skin: Positive for itching. Negative for rash.       At [facility] site.  Neurological: Positive for dizziness. Negative for loss of consciousness, weakness and headaches.   Physical Exam   Blood pressure 108/62, pulse 84, temperature 98.7 F (37.1 C), temperature source Oral, resp. rate 16, height 5' 3.5" (1.613 m), weight 70.364 kg (155 lb 2 oz), last menstrual period 09/07/2011, not currently breastfeeding.  Physical Exam  Constitutional: She appears well-developed. No distress.  Cardiovascular: Normal rate, regular rhythm, normal heart sounds and intact distal pulses.  Exam reveals no gallop and no friction rub.   No murmur heard. Respiratory: Effort normal and breath sounds normal. No respiratory distress.  GI: Soft. Bowel sounds are normal. She exhibits no  distension and no mass. There is tenderness. There is no rebound and no guarding.       Mild suprapubic tenderness  Genitourinary: There is no rash or lesion on the right labia. There is no rash or lesion on the left labia. Cervix exhibits discharge. Cervix exhibits no motion tenderness and no friability. Right adnexum displays tenderness. Right adnexum displays no mass. Left adnexum displays tenderness. Left adnexum displays no mass. No erythema, tenderness or bleeding around the vagina. Vaginal  discharge found.       cervical discharge clumpy, white  Skin: She is not diaphoretic.    MAU Course  Procedures SVE: pt tolerated well  MDM  Initial differential diagnosis: ectopic pregnancy, IUP, UTI/pyelonephritis, STI, yeast infection, PID, menstrual-related  Initial plan: wet prep G/C, urinalysis, Hcg  Results for orders placed during the hospital encounter of 09/18/11 (from the past 24 hour(s))  URINALYSIS, ROUTINE W REFLEX MICROSCOPIC     Status: Abnormal   Collection Time   09/18/11  9:55 AM      Component Value Range   Color, Urine YELLOW  YELLOW   APPearance CLEAR  CLEAR   Specific Gravity, Urine >1.030 (*) 1.005 - 1.030   pH 6.0  5.0 - 8.0   Glucose, UA NEGATIVE  NEGATIVE mg/dL   Hgb urine dipstick NEGATIVE  NEGATIVE   Bilirubin Urine NEGATIVE  NEGATIVE   Ketones, ur NEGATIVE  NEGATIVE mg/dL   Protein, ur NEGATIVE  NEGATIVE mg/dL   Urobilinogen, UA 1.0  0.0 - 1.0 mg/dL   Nitrite NEGATIVE  NEGATIVE   Leukocytes, UA NEGATIVE  NEGATIVE  POCT PREGNANCY, URINE     Status: Normal   Collection Time   09/18/11 10:07 AM      Component Value Range   Preg Test, Ur NEGATIVE  NEGATIVE  WET PREP, GENITAL     Status: Abnormal   Collection Time   09/18/11 11:05 AM      Component Value Range   Yeast Wet Prep HPF POC NONE SEEN  NONE SEEN   Trich, Wet Prep NONE SEEN  NONE SEEN   Clue Cells Wet Prep HPF POC NONE SEEN  NONE SEEN   WBC, Wet Prep HPF POC MODERATE (*) NONE SEEN    955 negative urinalysis 1007 negative pregnancy test 1105 wet prep moderate WBC, no trich, no BV, no yeast  Assessment and Plan   1. Back pain; UTI/pyelo ruled out with negative urinalysis 2. Abdominal pain; pregnancy ruled out, wet prep shows no sign of infection; G/C pending  1. Pt may take ibuprofen for back pain. 2. Will f/u with pt if G/C results are positive.   [facility]   Latina Craver, PA-S 09/18/2011, 10:41 AM

## 2011-09-18 NOTE — MAU Note (Signed)
Pt states was seen her for SAB 1 month ago. Took home upt negative on Thursday last week. Here with lower back pain that is constant, lower abd pain is intermittent, states "feels like labor pains". Denies abnormal vaginal d/c changes, however notes more than usual discharge. Denies bleeding at present. LMP 09/07/2011.

## 2011-09-19 LAB — GC/CHLAMYDIA PROBE AMP, GENITAL: Chlamydia, DNA Probe: NEGATIVE

## 2011-09-19 NOTE — MAU Provider Note (Signed)
Agree with above note.  Erin [name] 09/19/2011 7:15 AM

## 2011-10-22 ENCOUNTER — Inpatient Hospital Stay (HOSPITAL_COMMUNITY)
Admission: AD | Admit: 2011-10-22 | Discharge: 2011-10-22 | Discharge status: Against Medical Advice | Payer: Medicaid Other | Source: Ambulatory Visit | Attending: Obstetrics and Gynecology | Admitting: Obstetrics and Gynecology

## 2011-10-22 DIAGNOSIS — R109 Unspecified abdominal pain: Secondary | ICD-10-CM | POA: Insufficient documentation

## 2011-10-22 LAB — URINALYSIS, ROUTINE W REFLEX MICROSCOPIC
Bilirubin Urine: NEGATIVE
Ketones, ur: 15 mg/dL — AB
Leukocytes, UA: NEGATIVE
Nitrite: NEGATIVE
Protein, ur: NEGATIVE mg/dL
pH: 6.5 (ref 5.0–8.0)

## 2011-10-22 NOTE — MAU Note (Signed)
Not in lobby

## 2011-10-22 NOTE — MAU Note (Signed)
Pt not in lobby.  

## 2011-10-22 NOTE — MAU Note (Signed)
For last 3-4 days pt reports a pain or "weird feeling"  In lower abd. Nausea at times. Had Nexplanon implanted on 09/07/2011

## 2011-10-23 ENCOUNTER — Emergency Department (HOSPITAL_COMMUNITY)
Admission: EM | Admit: 2011-10-23 | Discharge: 2011-10-23 | Disposition: A | Discharge status: Home / Self Care | Payer: Medicaid Other | Attending: Emergency Medicine | Admitting: Emergency Medicine

## 2011-10-23 ENCOUNTER — Encounter (HOSPITAL_COMMUNITY): Payer: Self-pay

## 2011-10-23 DIAGNOSIS — J45909 Unspecified asthma, uncomplicated: Secondary | ICD-10-CM | POA: Insufficient documentation

## 2011-10-23 DIAGNOSIS — N390 Urinary tract infection, site not specified: Secondary | ICD-10-CM

## 2011-10-23 DIAGNOSIS — R102 Pelvic and perineal pain: Secondary | ICD-10-CM

## 2011-10-23 DIAGNOSIS — F411 Generalized anxiety disorder: Secondary | ICD-10-CM | POA: Insufficient documentation

## 2011-10-23 DIAGNOSIS — F419 Anxiety disorder, unspecified: Secondary | ICD-10-CM

## 2011-10-23 DIAGNOSIS — N949 Unspecified condition associated with female genital organs and menstrual cycle: Secondary | ICD-10-CM | POA: Insufficient documentation

## 2011-10-23 LAB — CBC WITH DIFFERENTIAL/PLATELET
Basophils Relative: 0 % (ref 0–1)
Eosinophils Absolute: 0.3 10*3/uL (ref 0.0–0.7)
Eosinophils Relative: 5 % (ref 0–5)
Hemoglobin: 13.1 g/dL (ref 12.0–15.0)
Lymphs Abs: 2 10*3/uL (ref 0.7–4.0)
MCH: 28 pg (ref 26.0–34.0)
MCHC: 33.5 g/dL (ref 30.0–36.0)
MCV: 83.5 fL (ref 78.0–100.0)
Monocytes Relative: 8 % (ref 3–12)
Neutrophils Relative %: 52 % (ref 43–77)
Platelets: 372 10*3/uL (ref 150–400)
RBC: 4.68 MIL/uL (ref 3.87–5.11)

## 2011-10-23 LAB — URINALYSIS, ROUTINE W REFLEX MICROSCOPIC
Hgb urine dipstick: NEGATIVE
Ketones, ur: NEGATIVE mg/dL
Nitrite: NEGATIVE
Protein, ur: 30 mg/dL — AB
Urobilinogen, UA: 1 mg/dL (ref 0.0–1.0)

## 2011-10-23 LAB — WET PREP, GENITAL: Trich, Wet Prep: NONE SEEN

## 2011-10-23 LAB — BASIC METABOLIC PANEL
BUN: 5 mg/dL — ABNORMAL LOW (ref 6–23)
Calcium: 9.2 mg/dL (ref 8.4–10.5)
Creatinine, Ser: 0.69 mg/dL (ref 0.50–1.10)
GFR calc Af Amer: >90 mL/min (ref >90)
GFR calc non Af Amer: >90 mL/min (ref >90)
Glucose, Bld: 104 mg/dL — ABNORMAL HIGH (ref 70–99)

## 2011-10-23 LAB — URINE MICROSCOPIC-ADD ON

## 2011-10-23 MED ORDER — ALBUTEROL SULFATE (5 MG/ML) 0.5% IN NEBU
INHALATION_SOLUTION | RESPIRATORY_TRACT | Status: AC
  Administered 2011-10-23: 5 mg
  Filled 2011-10-23: qty 1

## 2011-10-23 MED ORDER — POTASSIUM CHLORIDE CRYS ER 20 MEQ PO TBCR
40.0000 meq | EXTENDED_RELEASE_TABLET | Freq: Once | ORAL | Status: AC
  Administered 2011-10-23: 40 meq via ORAL
  Filled 2011-10-23: qty 2

## 2011-10-23 MED ORDER — SULFAMETHOXAZOLE-TRIMETHOPRIM 800-160 MG PO TABS: 1.0000 | ORAL_TABLET | Freq: Two times a day (BID) | ORAL | Status: AC

## 2011-10-23 NOTE — ED Provider Notes (Signed)
Medical screening examination/treatment/procedure(s) were performed by non-physician practitioner and as supervising physician I was immediately available for consultation/collaboration.    Sabah Zucco R Wavie Hashimi, MD 10/23/11 2111 

## 2011-10-23 NOTE — ED Provider Notes (Signed)
History     CSN: [id number]  Arrival date & time 10/23/11  1005   First MD Initiated Contact with Patient 10/23/11 1014      Chief Complaint  Patient presents with  . Shortness of Breath    (Consider location/radiation/quality/duration/timing/severity/associated sxs/prior treatment) Patient is a 21 y.o. female presenting with shortness of breath. The history is provided by the patient, a parent and medical records.  Shortness of Breath  Associated symptoms include chest pain, shortness of breath and wheezing. Pertinent negatives include no fever and no cough.   Erin [name] is a 21 y.o. female presents to the emergency department complaining of abdominal pain x4 days and new onset shortness of breath approximately one hour ago.  Patient states she had an Implanon placed in June 2013, was evaluated in July with a routine pelvic and Pap smear which was normal.  She describes the pain as cramping, suprapubic in location, intermittent and rated a 6/10.  It does not radiate anywhere.  Due to the abdominal pain she presented to [facility] last night for evaluation.  During that visit she was told that the Implanon may cause abdominal cramping and vaginal spotting and she was instructed to take ibuprofen.  She has taking ibuprofen without relief of the pain. Nothing makes the symptoms better or worse. She denies vaginal bleeding at this time.  Today, she had a sudden onset of shortness of breath, chest pain, tachypnea, and shaking of her left arm.  Her father states that she is involved with "circumstances that give her anxiety" however he did not elaborate.  She admits to a 30 pound weight loss over the summer, some intentional and some due to loss of appetite.  She has had an associated headache in the past 4 days but does not  currently.  She is in a monogamous relationship and is unconcerned about STI's.  She has taken a home pregnancy test She denies vaginal bleeding, vaginal discharge, nausea,  vomiting, diarrhea, cough, congestion, rhinorrhea, fever, chills..    Past Medical History  Diagnosis Date  . Asthma   . Miscarriage     Past Surgical History  Procedure Date  . Tab   . Dilation and curettage of uterus     Family History  Problem Relation Age of Onset  . Anesthesia problems Neg Hx   . Hypotension Neg Hx   . Malignant hyperthermia Neg Hx   . Pseudochol deficiency Neg Hx   . Hyperlipidemia Father   . Hypertension Father   . Cancer Paternal Aunt   . Cancer Maternal Grandfather     History  Substance Use Topics  . Smoking status: Never Smoker   . Smokeless tobacco: Not on file  . Alcohol Use: No    OB History    Grav Para Term Preterm Abortions TAB SAB Ect Mult Living   3 1 1  0 2 1 1  0 0 1      Review of Systems  Constitutional: Positive for appetite change. Negative for fever, diaphoresis, fatigue and unexpected weight change.  HENT: Negative for mouth sores.   Eyes: Negative for visual disturbance.  Respiratory: Positive for chest tightness, shortness of breath and wheezing. Negative for cough.   Cardiovascular: Positive for chest pain. Negative for palpitations and leg swelling.  Gastrointestinal: Positive for abdominal pain. Negative for nausea, vomiting, diarrhea and constipation.  Genitourinary: Positive for pelvic pain. Negative for dysuria, urgency, frequency, hematuria, vaginal bleeding, vaginal discharge and vaginal pain.  Musculoskeletal: Negative for  back pain.  Skin: Negative for rash.  Neurological: Positive for headaches. Negative for syncope and light-headedness.  Hematological: Does not bruise/bleed easily.  Psychiatric/Behavioral: Negative for disturbed wake/sleep cycle. The patient is nervous/anxious.     Allergies  Review of patient's allergies indicates no known allergies.  Home Medications   Current Outpatient Rx  Name Route Sig Dispense Refill  . ALBUTEROL SULFATE HFA 108 (90 BASE) MCG/ACT IN AERS Inhalation Inhale 2  puffs into the lungs every 6 (six) hours as needed. For asthma attacks/wheezing    . CETIRIZINE HCL 10 MG PO TABS Oral Take 10 mg by mouth daily.      BP 114/85  Pulse 95  Temp 97.7 F (36.5 C) (Oral)  Resp 18  SpO2 100%  Physical Exam  Nursing note and vitals reviewed. Constitutional: She appears well-developed and well-nourished. No distress.  HENT:  Head: Normocephalic and atraumatic.  Mouth/Throat: Oropharynx is clear and moist. No oropharyngeal exudate.  Eyes: Conjunctivae are normal. No scleral icterus.  Neck: Normal range of motion. Neck supple.  Cardiovascular: Normal rate, regular rhythm and intact distal pulses.   Pulmonary/Chest: Effort normal and breath sounds normal. No respiratory distress. She has no wheezes. She exhibits no tenderness.  Abdominal: Soft. Bowel sounds are normal. She exhibits no mass. There is no hepatosplenomegaly. There is tenderness in the suprapubic area. There is no rebound, no guarding, no CVA tenderness, no tenderness at McBurney's point and negative Murphy's sign.  Genitourinary: Vagina normal. There is no rash, tenderness or lesion on the right labia. There is no rash, tenderness or lesion on the left labia. Uterus is enlarged and tender. Uterus is not deviated. Cervix exhibits motion tenderness and discharge (small amount of white curd-like). Cervix exhibits no friability. Right adnexum displays no mass, no tenderness and no fullness. Left adnexum displays no mass, no tenderness and no fullness. No erythema, tenderness or bleeding around the vagina. No foreign body around the vagina. No vaginal discharge found.  Musculoskeletal: Normal range of motion. She exhibits no edema.  Lymphadenopathy:    She has no cervical adenopathy.  Neurological: She is alert.       Speech is clear and goal oriented Moves extremities without ataxia  Skin: Skin is warm and dry. She is not diaphoretic.  Psychiatric: She has a normal mood and affect.    ED Course    Procedures (including critical care time)  Labs Reviewed  BASIC METABOLIC PANEL - Abnormal; Notable for the following:    Potassium 2.8 (*)     Glucose, Bld 104 (*)     BUN 5 (*)     All other components within normal limits  URINALYSIS, ROUTINE W REFLEX MICROSCOPIC - Abnormal; Notable for the following:    APPearance CLOUDY (*)     Bilirubin Urine SMALL (*)     Protein, ur 30 (*)     Leukocytes, UA SMALL (*)     All other components within normal limits  WET PREP, GENITAL - Abnormal; Notable for the following:    Clue Cells Wet Prep HPF POC FEW (*)     WBC, Wet Prep HPF POC FEW (*)     All other components within normal limits  URINE MICROSCOPIC-ADD ON - Abnormal; Notable for the following:    Squamous Epithelial / LPF MANY (*)     Bacteria, UA MANY (*)     All other components within normal limits  CBC WITH DIFFERENTIAL  GC/CHLAMYDIA PROBE AMP, GENITAL   No results  found.  Results for orders placed during the hospital encounter of 10/23/11  CBC WITH DIFFERENTIAL      Component Value Range   WBC 5.7  4.0 - 10.5 K/uL   RBC 4.68  3.87 - 5.11 MIL/uL   Hemoglobin 13.1  12.0 - 15.0 g/dL   HCT 16.1  09.6 - 04.5 %   MCV 83.5  78.0 - 100.0 fL   MCH 28.0  26.0 - 34.0 pg   MCHC 33.5  30.0 - 36.0 g/dL   RDW 40.9  81.1 - 91.4 %   Platelets 372  150 - 400 K/uL   Neutrophils Relative 52  43 - 77 %   Neutro Abs 3.0  1.7 - 7.7 K/uL   Lymphocytes Relative 35  12 - 46 %   Lymphs Abs 2.0  0.7 - 4.0 K/uL   Monocytes Relative 8  3 - 12 %   Monocytes Absolute 0.5  0.1 - 1.0 K/uL   Eosinophils Relative 5  0 - 5 %   Eosinophils Absolute 0.3  0.0 - 0.7 K/uL   Basophils Relative 0  0 - 1 %   Basophils Absolute 0.0  0.0 - 0.1 K/uL  BASIC METABOLIC PANEL      Component Value Range   Sodium 139  135 - 145 mEq/L   Potassium 2.8 (*) 3.5 - 5.1 mEq/L   Chloride 104  96 - 112 mEq/L   CO2 25  19 - 32 mEq/L   Glucose, Bld 104 (*) 70 - 99 mg/dL   BUN 5 (*) 6 - 23 mg/dL   Creatinine, Ser 7.82   0.50 - 1.10 mg/dL   Calcium 9.2  8.4 - 95.6 mg/dL   GFR calc non Af Amer >90  >90 mL/min   GFR calc Af Amer >90  >90 mL/min  URINALYSIS, ROUTINE W REFLEX MICROSCOPIC      Component Value Range   Color, Urine YELLOW  YELLOW   APPearance CLOUDY (*) CLEAR   Specific Gravity, Urine 1.030  1.005 - 1.030   pH 5.5  5.0 - 8.0   Glucose, UA NEGATIVE  NEGATIVE mg/dL   Hgb urine dipstick NEGATIVE  NEGATIVE   Bilirubin Urine SMALL (*) NEGATIVE   Ketones, ur NEGATIVE  NEGATIVE mg/dL   Protein, ur 30 (*) NEGATIVE mg/dL   Urobilinogen, UA 1.0  0.0 - 1.0 mg/dL   Nitrite NEGATIVE  NEGATIVE   Leukocytes, UA SMALL (*) NEGATIVE  WET PREP, GENITAL      Component Value Range   Yeast Wet Prep HPF POC NONE SEEN  NONE SEEN   Trich, Wet Prep NONE SEEN  NONE SEEN   Clue Cells Wet Prep HPF POC FEW (*) NONE SEEN   WBC, Wet Prep HPF POC FEW (*) NONE SEEN  URINE MICROSCOPIC-ADD ON      Component Value Range   Squamous Epithelial / LPF MANY (*) RARE   WBC, UA 3-6  <3 WBC/hpf   Bacteria, UA MANY (*) RARE   Urine-Other MUCOUS PRESENT       1. Anxiety   2. UTI (lower urinary tract infection)   3. Pelvic pain in female       MDM  FIZZA SCALES presents with abdominal pain, short lived shortness of breath, chest pain.  Patient states she does not feel stressed but is able to list a number of life stressors at this time. CBC with hypokalemia, replaced orally in the ED.  BMP unremarkable.  Wet prep with a  few clue cells, but otherwise unremarkable.  Urinalysis with many bacteria some white blood cells and leukocytes.  It appears the patient's suprapubic pelvic pain secondary to urinary tract infection.  Lack of respiratory symptoms on arrival in the emergency department does not make me suspicious for an asthma attack.  She is at low risk for PE based on the Well's criteria and PERC negative.  I do believe the patient may have had a panic attack.  Her urine pregnancy is negative as of last night.  I will discharge  home with treatment for the UTI and recommend follow up with the primary care physician.  1. Medications: bactrim 2. Treatment: rest, hydrate 3. Follow Up: with Primary care physician        Dierdre Forth, PA-C 10/23/11 1246

## 2011-10-23 NOTE — ED Notes (Signed)
Up to the bathroom 

## 2011-10-23 NOTE — ED Notes (Signed)
Pt with c/o shortness of breath for approx 20 mins, low abdominal cramping

## 2011-10-24 LAB — GC/CHLAMYDIA PROBE AMP, GENITAL
Chlamydia, DNA Probe: NEGATIVE
GC Probe Amp, Genital: NEGATIVE

## 2013-02-24 ENCOUNTER — Encounter (HOSPITAL_COMMUNITY): Payer: Self-pay | Admitting: Emergency Medicine

## 2013-02-24 ENCOUNTER — Emergency Department (INDEPENDENT_AMBULATORY_CARE_PROVIDER_SITE_OTHER)
Admission: EM | Admit: 2013-02-24 | Discharge: 2013-02-24 | Disposition: A | Discharge status: Home / Self Care | Payer: Self-pay | Source: Home / Self Care | Attending: Family Medicine | Admitting: Family Medicine

## 2013-02-24 DIAGNOSIS — J40 Bronchitis, not specified as acute or chronic: Secondary | ICD-10-CM

## 2013-02-24 MED ORDER — HYDROCODONE-ACETAMINOPHEN 5-325 MG PO TABS: 0.5000 | ORAL_TABLET | Freq: Every evening | ORAL | Status: DC | PRN

## 2013-02-24 MED ORDER — ALBUTEROL SULFATE HFA 108 (90 BASE) MCG/ACT IN AERS
2.0000 | INHALATION_SPRAY | Freq: Four times a day (QID) | RESPIRATORY_TRACT | Status: DC | PRN
  Administered 2013-02-24: 2 via RESPIRATORY_TRACT

## 2013-02-24 MED ORDER — ALBUTEROL SULFATE HFA 108 (90 BASE) MCG/ACT IN AERS
INHALATION_SPRAY | RESPIRATORY_TRACT | Status: AC
  Filled 2013-02-24: qty 6.7

## 2013-02-24 MED ORDER — PREDNISONE 50 MG PO TABS: 50.0000 mg | ORAL_TABLET | Freq: Every day | ORAL | Status: DC

## 2013-02-24 MED ORDER — AMOXICILLIN 500 MG PO CAPS: 500.0000 mg | ORAL_CAPSULE | Freq: Three times a day (TID) | ORAL | Status: DC

## 2013-02-24 NOTE — ED Provider Notes (Signed)
Erin [name] is a 22 y.o. female who presents to Urgent Care today for cough congestion with a runny and stuffiness. Patient denies any significant fever. She has had one or 2 episodes of vomiting but is feeling well currently. She denies any chest pain palpitations or trouble breathing.  She does have a medical history significant for asthma but is out of her albuterol and Advair. She does not feel wheezing or chest tightness. She's here today for a note to return to work.   Additionally patient notes upper right gum pain. This is an incidental finding and she would like evaluated but she is here. She does not have much tooth pain. No fevers or chills.   Past Medical History  Diagnosis Date  . Asthma   . Miscarriage    History  Substance Use Topics  . Smoking status: Never Smoker   . Smokeless tobacco: Not on file  . Alcohol Use: No   ROS as above Medications reviewed. Current Facility-Administered Medications  Medication Dose Route Frequency Provider Last Rate Last Dose  . albuterol (PROVENTIL HFA;VENTOLIN HFA) 108 (90 BASE) MCG/ACT inhaler 2 puff  2 puff Inhalation Q6H PRN Rodolph Bong, MD   2 puff at 02/24/13 1152   Current Outpatient Prescriptions  Medication Sig Dispense Refill  . albuterol (PROVENTIL HFA;VENTOLIN HFA) 108 (90 BASE) MCG/ACT inhaler Inhale 2 puffs into the lungs every 6 (six) hours as needed. For asthma attacks/wheezing      . amoxicillin (AMOXIL) 500 MG capsule Take 1 capsule (500 mg total) by mouth 3 (three) times daily.  21 capsule  0  . cetirizine (ZYRTEC) 10 MG tablet Take 10 mg by mouth daily.      Marland Kitchen HYDROcodone-acetaminophen (NORCO/VICODIN) 5-325 MG per tablet Take 0.5 tablets by mouth at bedtime as needed (cough).  6 tablet  0  . predniSONE (DELTASONE) 50 MG tablet Take 1 tablet (50 mg total) by mouth daily.  5 tablet  0    Exam:  BP 125/70  Pulse 102  Temp(Src) 99.2 F (37.3 C) (Oral)  Resp 18  SpO2 98% Gen: Well NAD HEENT: EOMI,  MMM, tiny dental  abscess upper right tooth. The abscess is located on the gum line. The tooth is erroded. Posterior pharynx with cobblestoning. Tympanic membranes are normal appearing bilaterally. Lungs: Normal work of breathing. CTABL Heart: RRR no MRG Abd: NABS, Soft. NT, ND Exts: Non edematous BL  LE, warm and well perfused.   No results found for this or any previous visit (from the past 24 hour(s)). No results found.  Assessment and Plan: 22 y.o. female with  1) viral URI. Plan for symptomatic management with prednisone, and hydrocodone containing cough medication. Additionally I will dispense albuterol inhaler in case patient develops wheezing.  2) dental abscess: I offered incision and drainage however patient declined. We'll attempt to treat with antibiotics.  Discussed warning signs or symptoms. Please see discharge instructions. Patient expresses understanding.      Rodolph Bong, MD 02/24/13 [phone]

## 2013-02-24 NOTE — ED Notes (Signed)
Pt  Reports      Symptoms  Of  Body  Aches         Chills        sorethroat   /  Vomiting           With  The  Symptoms      Since  Last  Week            Ptis  Masked  Sitting  Upright on  Exam table  Speaking in  Complete  sentances

## 2013-03-09 ENCOUNTER — Emergency Department (HOSPITAL_COMMUNITY): Payer: Self-pay

## 2013-03-09 ENCOUNTER — Emergency Department (HOSPITAL_COMMUNITY)
Admission: EM | Admit: 2013-03-09 | Discharge: 2013-03-09 | Disposition: A | Discharge status: Home / Self Care | Payer: Self-pay | Attending: Emergency Medicine | Admitting: Emergency Medicine

## 2013-03-09 ENCOUNTER — Encounter (HOSPITAL_COMMUNITY): Payer: Self-pay | Admitting: Emergency Medicine

## 2013-03-09 DIAGNOSIS — M549 Dorsalgia, unspecified: Secondary | ICD-10-CM | POA: Insufficient documentation

## 2013-03-09 DIAGNOSIS — J069 Acute upper respiratory infection, unspecified: Secondary | ICD-10-CM | POA: Insufficient documentation

## 2013-03-09 DIAGNOSIS — J45901 Unspecified asthma with (acute) exacerbation: Secondary | ICD-10-CM | POA: Insufficient documentation

## 2013-03-09 DIAGNOSIS — Z792 Long term (current) use of antibiotics: Secondary | ICD-10-CM | POA: Insufficient documentation

## 2013-03-09 DIAGNOSIS — R110 Nausea: Secondary | ICD-10-CM | POA: Insufficient documentation

## 2013-03-09 DIAGNOSIS — Z79899 Other long term (current) drug therapy: Secondary | ICD-10-CM | POA: Insufficient documentation

## 2013-03-09 DIAGNOSIS — R51 Headache: Secondary | ICD-10-CM | POA: Insufficient documentation

## 2013-03-09 MED ORDER — PREDNISONE 20 MG PO TABS: 60.0000 mg | ORAL_TABLET | Freq: Every day | ORAL | Status: DC

## 2013-03-09 MED ORDER — GUAIFENESIN-CODEINE 100-10 MG/5ML PO SOLN: 5.0000 mL | Freq: Three times a day (TID) | ORAL | Status: DC | PRN

## 2013-03-09 NOTE — ED Provider Notes (Signed)
CSN: [id number]     Arrival date & time 03/09/13  1908 History  This chart was scribed for non-physician practitioner, Santiago Glad, PA-C working with Glynn Octave, MD by Greggory Stallion, ED scribe. This patient was seen in room TR07C/TR07C and the patient's care was started at 8:10 PM.   Chief Complaint  Patient presents with  . Cough   The history is provided by the patient. No language interpreter was used.   HPI Comments: Erin [name] is a 22 y.o. female with history of chronic asthma who presents to the Emergency Department complaining of cough, congestion, left back pain, headache and nausea that started several weeks ago. Pt states she was having emesis earlier this month but denies it currently. She had a fever of 100.3 a few days ago but states it went down. States she also has sinus pressure. She has used her inhaler 3 times per day with little relief. Pt was previously seen at an urgent care and given an antibiotic and a steroid with temporary relief. She states that her symptoms improved on 02-28-13, but then returned again four days ago.  Denies sore throat.   Past Medical History  Diagnosis Date  . Asthma   . Miscarriage    Past Surgical History  Procedure Laterality Date  . Tab    . Dilation and curettage of uterus     Family History  Problem Relation Age of Onset  . Anesthesia problems Neg Hx   . Hypotension Neg Hx   . Malignant hyperthermia Neg Hx   . Pseudochol deficiency Neg Hx   . Hyperlipidemia Father   . Hypertension Father   . Cancer Paternal Aunt   . Cancer Maternal Grandfather    History  Substance Use Topics  . Smoking status: Never Smoker   . Smokeless tobacco: Not on file  . Alcohol Use: No   OB History   Grav Para Term Preterm Abortions TAB SAB Ect Mult Living   3 1 1  0 2 1 1  0 0 1     Review of Systems  Constitutional: Negative for fever.  HENT: Positive for congestion and sinus pressure. Negative for sore throat.   Respiratory: Positive  for cough and shortness of breath.   Gastrointestinal: Positive for nausea. Negative for vomiting.  Musculoskeletal: Positive for back pain.  Neurological: Positive for headaches.  All other systems reviewed and are negative.    Allergies  Review of patient's allergies indicates no known allergies.  Home Medications   Current Outpatient Rx  Name  Route  Sig  Dispense  Refill  . albuterol (PROVENTIL HFA;VENTOLIN HFA) 108 (90 BASE) MCG/ACT inhaler   Inhalation   Inhale 2 puffs into the lungs every 6 (six) hours as needed. For asthma attacks/wheezing         . amoxicillin (AMOXIL) 500 MG capsule   Oral   Take 1 capsule (500 mg total) by mouth 3 (three) times daily.   21 capsule   0   . cetirizine (ZYRTEC) 10 MG tablet   Oral   Take 10 mg by mouth daily.         Marland Kitchen HYDROcodone-acetaminophen (NORCO/VICODIN) 5-325 MG per tablet   Oral   Take 0.5 tablets by mouth at bedtime as needed (cough).   6 tablet   0   . predniSONE (DELTASONE) 50 MG tablet   Oral   Take 1 tablet (50 mg total) by mouth daily.   5 tablet   0  BP 136/78  Pulse 53  Temp(Src) 99.9 F (37.7 C) (Oral)  Resp 18  Wt 182 lb 4.8 oz (82.691 kg)  SpO2 100%  Physical Exam  Nursing note and vitals reviewed. Constitutional: She appears well-developed and well-nourished.  HENT:  Head: Normocephalic and atraumatic.  Right Ear: Tympanic membrane normal.  Left Ear: Tympanic membrane normal.  Nose: Mucosal edema and rhinorrhea present. Right sinus exhibits no maxillary sinus tenderness and no frontal sinus tenderness. Left sinus exhibits no maxillary sinus tenderness and no frontal sinus tenderness.  Mouth/Throat: Oropharynx is clear and moist.  Eyes: EOM are normal. Pupils are equal, round, and reactive to light.  Neck: Normal range of motion. Neck supple.  Cardiovascular: Normal rate, regular rhythm and normal heart sounds.   Pulmonary/Chest: Effort normal and breath sounds normal. She has no wheezes.  She has no rhonchi. She has no rales.  Abdominal: Soft. There is no tenderness.  Musculoskeletal: Normal range of motion.  Neurological: She is alert.  Skin: Skin is warm and dry.  Psychiatric: She has a normal mood and affect. Her behavior is normal.    ED Course  Procedures (including critical care time)  DIAGNOSTIC STUDIES: Oxygen Saturation is 100% on RA, normal by my interpretation.    COORDINATION OF CARE: 8:14 PM-Discussed treatment plan which includes chest xray with pt at bedside and pt agreed to plan.   Labs Review Labs Reviewed - No data to display Imaging Review Dg Chest 2 View  03/09/2013   CLINICAL DATA:  Productive cough for 1 month.  Flu like symptoms.  EXAM: CHEST  2 VIEW  COMPARISON:  07/05/2008.  FINDINGS: The heart size and mediastinal contours are within normal limits. Both lungs are clear. The visualized skeletal structures are unremarkable.  IMPRESSION: No active cardiopulmonary disease.   Electronically Signed   By: Andreas Newport M.D.   On: 03/09/2013 20:51    EKG Interpretation   None       MDM  No diagnosis found. Pt CXR negative for acute infiltrate. Patients symptoms are consistent with URI, likely viral etiology. Discussed that antibiotics are not indicated for viral infections. Pt will be discharged with symptomatic treatment.  Verbalizes understanding and is agreeable with plan. Pt is hemodynamically stable & in NAD prior to dc.   I personally performed the services described in this documentation, which was scribed in my presence. The recorded information has been reviewed and is accurate.   Santiago Glad, PA-C 03/10/13 0003

## 2013-03-09 NOTE — ED Notes (Signed)
Pt reports running a fever earlier today.

## 2013-03-09 NOTE — ED Notes (Signed)
Pt. reports chronic cough for several weeks with nausea /vomitting and left back pain , seen at an urgent care prescribed with antibiotic .

## 2013-03-10 NOTE — ED Provider Notes (Signed)
Medical screening examination/treatment/procedure(s) were performed by non-physician practitioner and as supervising physician I was immediately available for consultation/collaboration.  EKG Interpretation   None         Glynn Octave, MD 03/10/13 [phone]

## 2014-01-18 ENCOUNTER — Encounter (HOSPITAL_COMMUNITY): Payer: Self-pay | Admitting: Emergency Medicine

## 2014-02-08 ENCOUNTER — Emergency Department (HOSPITAL_COMMUNITY)
Admission: EM | Admit: 2014-02-08 | Discharge: 2014-02-08 | Disposition: A | Discharge status: Home / Self Care | Payer: Self-pay | Attending: Emergency Medicine | Admitting: Emergency Medicine

## 2014-02-08 ENCOUNTER — Emergency Department (HOSPITAL_COMMUNITY): Payer: Self-pay

## 2014-02-08 ENCOUNTER — Encounter (HOSPITAL_COMMUNITY): Payer: Self-pay | Admitting: Emergency Medicine

## 2014-02-08 DIAGNOSIS — J45901 Unspecified asthma with (acute) exacerbation: Secondary | ICD-10-CM | POA: Insufficient documentation

## 2014-02-08 DIAGNOSIS — R0789 Other chest pain: Secondary | ICD-10-CM

## 2014-02-08 DIAGNOSIS — R42 Dizziness and giddiness: Secondary | ICD-10-CM | POA: Insufficient documentation

## 2014-02-08 DIAGNOSIS — Z79899 Other long term (current) drug therapy: Secondary | ICD-10-CM | POA: Insufficient documentation

## 2014-02-08 LAB — BASIC METABOLIC PANEL
Anion gap: 13 (ref 5–15)
BUN: 8 mg/dL (ref 6–23)
CHLORIDE: 104 meq/L (ref 96–112)
CO2: 25 mEq/L (ref 19–32)
Calcium: 9 mg/dL (ref 8.4–10.5)
Creatinine, Ser: 0.67 mg/dL (ref 0.50–1.10)
GFR calc non Af Amer: >90 mL/min (ref >90)
GLUCOSE: 105 mg/dL — AB (ref 70–99)
POTASSIUM: 3.7 meq/L (ref 3.7–5.3)
SODIUM: 142 meq/L (ref 137–147)

## 2014-02-08 LAB — CBC
HEMATOCRIT: 37.4 % (ref 36.0–46.0)
HEMOGLOBIN: 12 g/dL (ref 12.0–15.0)
MCH: 26.9 pg (ref 26.0–34.0)
MCHC: 32.1 g/dL (ref 30.0–36.0)
MCV: 83.9 fL (ref 78.0–100.0)
Platelets: 396 10*3/uL (ref 150–400)
RBC: 4.46 MIL/uL (ref 3.87–5.11)
RDW: 12.6 % (ref 11.5–15.5)
WBC: 6 10*3/uL (ref 4.0–10.5)

## 2014-02-08 LAB — I-STAT TROPONIN, ED: TROPONIN I, POC: 0.01 ng/mL (ref 0.00–0.08)

## 2014-02-08 LAB — D-DIMER, QUANTITATIVE: D-Dimer, Quant: 0.27 ug/mL-FEU (ref 0.00–0.48)

## 2014-02-08 MED ORDER — LORAZEPAM 1 MG PO TABS: 1.0000 mg | ORAL_TABLET | Freq: Four times a day (QID) | ORAL | Status: DC | PRN

## 2014-02-08 NOTE — ED Notes (Signed)
Pt presents with left sided chest tightness that started last night, reports difficulty breathing and dizziness as well.  Admits to productive cough yellow in color.  Pt speaking in full sentences at present, no respiratory distress noted.

## 2014-02-08 NOTE — ED Notes (Addendum)
Pt sts she has a history of anxiety.  Pt sts the symptoms felt very similar to anxiety attack except the pressure/pain in her chest has never occurred before.

## 2014-02-08 NOTE — Discharge Instructions (Signed)

## 2014-02-08 NOTE — ED Provider Notes (Signed)
CSN: [id number]     Arrival date & time 02/08/14  1135 History   First MD Initiated Contact with Patient 02/08/14 1348     Chief Complaint  Patient presents with  . Chest Pain     (Consider location/radiation/quality/duration/timing/severity/associated sxs/prior Treatment) HPI Comments: Patient is a 23 year old female with history of asthma who presents the emergency department today for evaluation of chest pain, tightness, and shortness of breath. She reports that this all began around 11 AM. Her symptoms resolved spontaneously by 2 PM. They were constant for the 3 hours. This feels exactly the same as prior anxiety attacks, but the chest pressure was new. 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 She did not take any medications or do anything to improve her symptoms. She denies any family history of early heart disease. No long trips or recent surgeries. She does have the Nexplanon implant.   The history is provided by the patient. No language interpreter was used.    Past Medical History  Diagnosis Date  . Asthma   . Miscarriage    Past Surgical History  Procedure Laterality Date  . Tab    . Dilation and curettage of uterus     Family History  Problem Relation Age of Onset  . Anesthesia problems Neg Hx   . Hypotension Neg Hx   . Malignant hyperthermia Neg Hx   . Pseudochol deficiency Neg Hx   . Hyperlipidemia Father   . Hypertension Father   . Cancer Paternal Aunt   . Cancer Maternal Grandfather    History  Substance Use Topics  . Smoking status: Never Smoker   . Smokeless tobacco: Not on file  . Alcohol Use: No   OB History    Gravida Para Term Preterm AB TAB SAB Ectopic Multiple Living   3 1 1  0 2 1 1  0 0 1     Review of Systems  Constitutional: Negative for fever and chills.  Respiratory: Positive for chest tightness and shortness of breath.   Cardiovascular: Positive for chest pain. Negative for leg swelling.  Gastrointestinal: Negative for  nausea, vomiting and abdominal pain.  Neurological: Positive for light-headedness.  All other systems reviewed and are negative.     Allergies  Review of patient's allergies indicates no known allergies.  Home Medications   Prior to Admission medications   Medication Sig Start Date End Date Taking? Authorizing Provider  albuterol (PROVENTIL HFA;VENTOLIN HFA) 108 (90 BASE) MCG/ACT inhaler Inhale 2 puffs into the lungs every 6 (six) hours as needed for wheezing or shortness of breath.    Yes Historical Provider, MD  cetirizine (ZYRTEC) 10 MG tablet Take 10 mg by mouth daily.   Yes Historical Provider, MD  guaiFENesin-codeine 100-10 MG/5ML syrup Take 5 mLs by mouth 3 (three) times daily as needed for cough. Patient not taking: Reported on 02/08/2014 03/09/13   Santiago GladHeather Laisure, PA-C  predniSONE (DELTASONE) 20 MG tablet Take 3 tablets (60 mg total) by mouth daily. Patient not taking: Reported on 02/08/2014 03/09/13   Santiago GladHeather Laisure, PA-C   BP 109/63 mmHg  Pulse 89  Temp(Src) 98.2 F (36.8 C) (Oral)  Resp 16  SpO2 100%  LMP 02/04/2014 (Approximate) Physical Exam  Constitutional: She is oriented to person, place, and time. She appears well-developed and well-nourished. No distress.  HENT:  Head: Normocephalic and atraumatic.  Right Ear: External ear normal.  Left Ear: External ear normal.  Nose: Nose normal.  Mouth/Throat: Oropharynx is clear and moist.  Eyes: Conjunctivae are normal.  Neck: Normal range of motion.  Cardiovascular: Normal rate, regular rhythm, normal heart sounds, intact distal pulses and normal pulses.   Pulses:      Radial pulses are 2+ on the right side, and 2+ on the left side.       Posterior tibial pulses are 2+ on the left side.  No leg swelling or tenderness   Pulmonary/Chest: Effort normal and breath sounds normal. No stridor. No respiratory distress. She has no wheezes. She has no rales. She exhibits no tenderness and no bony tenderness.  Abdominal:  Soft. She exhibits no distension. There is no tenderness.  Musculoskeletal: Normal range of motion.  Neurological: She is alert and oriented to person, place, and time. She has normal strength.  Skin: Skin is warm and dry. She is not diaphoretic. No erythema.  Psychiatric: She has a normal mood and affect. Her behavior is normal.  Nursing note and vitals reviewed.   ED Course  Procedures (including critical care time) Labs Review Labs Reviewed  BASIC METABOLIC PANEL - Abnormal; Notable for the following:    Glucose, Bld 105 (*)    All other components within normal limits  CBC  D-DIMER, QUANTITATIVE  I-STAT TROPOININ, ED    Imaging Review Dg Chest 2 View  02/08/2014   CLINICAL DATA:  Chest tightness off and on, increased today, dizziness today, history asthma  EXAM: CHEST  2 VIEW  COMPARISON:  03/09/2013  FINDINGS: Normal heart size, mediastinal contours, and pulmonary vascularity.  Lungs clear.  No pleural effusion or pneumothorax.  Minimal scoliosis thoracic spine.  IMPRESSION: No acute abnormalities.   Electronically Signed   By: Ulyses SouthwardMark  Boles M.D.   On: 02/08/2014 12:44     EKG Interpretation   Date/Time:  Monday February 08 2014 11:43:02 EST Ventricular Rate:  102 PR Interval:  134 QRS Duration: 86 QT Interval:  354 QTC Calculation: 461 R Axis:   73 Text Interpretation:  Sinus tachycardia Otherwise normal ECG ED PHYSICIAN  INTERPRETATION AVAILABLE IN [facility] Confirmed by TEST, Record  ([id number]) on 02/10/2014 8:01:36 AM      MDM   Final diagnoses:  Chest tightness    Patient is to be discharged with recommendation to follow up with PCP in regards to today's hospital visit. Chest pain is not likely of cardiac or pulmonary etiology d/t presentation, negative d dimer, VSS, no tracheal deviation, no JVD or new murmur, RRR, breath sounds equal bilaterally, EKG without acute abnormalities, negative troponin, and negative CXR. Pt has been advised to return to the ED  is CP becomes exertional, associated with diaphoresis or nausea, radiates to left jaw/arm, worsens or becomes concerning in any way. Patient was given ativan trial as this feels like prior anxiety attacks. Discussed no driving, operating heavy machinery, or combining Ativan with alcohol. Pt appears reliable for follow up and is agreeable to discharge.     Mora BellmanHannah S Peaches Vanoverbeke, PA-C 02/10/14 1433  Flint MelterElliott L Wentz, MD 02/11/14 [phone]

## 2014-04-18 ENCOUNTER — Emergency Department (INDEPENDENT_AMBULATORY_CARE_PROVIDER_SITE_OTHER)
Admission: EM | Admit: 2014-04-18 | Discharge: 2014-04-18 | Disposition: A | Discharge status: Home / Self Care | Payer: PRIVATE HEALTH INSURANCE | Source: Home / Self Care | Attending: Family Medicine | Admitting: Family Medicine

## 2014-04-18 ENCOUNTER — Encounter (HOSPITAL_COMMUNITY): Payer: Self-pay

## 2014-04-18 DIAGNOSIS — S46811A Strain of other muscles, fascia and tendons at shoulder and upper arm level, right arm, initial encounter: Secondary | ICD-10-CM | POA: Diagnosis not present

## 2014-04-18 MED ORDER — CYCLOBENZAPRINE HCL 5 MG PO TABS: 5.0000 mg | ORAL_TABLET | Freq: Every evening | ORAL | Status: DC | PRN

## 2014-04-18 MED ORDER — DICLOFENAC SODIUM 50 MG PO TBEC: 50.0000 mg | DELAYED_RELEASE_TABLET | Freq: Two times a day (BID) | ORAL | Status: DC | PRN
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
## 2014-04-18 NOTE — Discharge Instructions (Signed)
Thank you for coming in today. Come back or go to the emergency room if you notice new weakness new numbness problems walking or bowel or bladder problems. Attend physical therapy  Cervical Sprain A cervical sprain is an injury in the neck in which the strong, fibrous tissues (ligaments) that connect your neck bones stretch or tear. Cervical sprains can range from mild to severe. Severe cervical sprains can cause the neck vertebrae to be unstable. This can lead to damage of the spinal cord and can result in serious nervous system problems. The amount of time it takes for a cervical sprain to get better depends on the cause and extent of the injury. Most cervical sprains heal in 1 to 3 weeks. CAUSES  Severe cervical sprains may be caused by:   Contact sport injuries (such as from football, rugby, wrestling, hockey, auto racing, gymnastics, diving, martial arts, or boxing).   Motor vehicle collisions.   Whiplash injuries. This is an injury from a sudden forward and backward whipping movement of the head and neck.  Falls.  Mild cervical sprains may be caused by:   Being in an awkward position, such as while cradling a telephone between your ear and shoulder.   Sitting in a chair that does not offer proper support.   Working at a poorly Marketing executive station.   Looking up or down for long periods of time.  SYMPTOMS   Pain, soreness, stiffness, or a burning sensation in the front, back, or sides of the neck. This discomfort may develop immediately after the injury or slowly, 24 hours or more after the injury.   Pain or tenderness directly in the middle of the back of the neck.   Shoulder or upper back pain.   Limited ability to move the neck.   Headache.   Dizziness.   Weakness, numbness, or tingling in the hands or arms.   Muscle spasms.   Difficulty swallowing or chewing.   Tenderness and swelling of the neck.  DIAGNOSIS  Most of the time your health  care provider can diagnose a cervical sprain by taking your history and doing a physical exam. Your health care provider will ask about previous neck injuries and any known neck problems, such as arthritis in the neck. X-rays may be taken to find out if there are any other problems, such as with the bones of the neck. Other tests, such as a CT scan or MRI, may also be needed.  TREATMENT  Treatment depends on the severity of the cervical sprain. Mild sprains can be treated with rest, keeping the neck in place (immobilization), and pain medicines. Severe cervical sprains are immediately immobilized. Further treatment is done to help with pain, muscle spasms, and other symptoms and may include:  Medicines, such as pain relievers, numbing medicines, or muscle relaxants.   Physical therapy. This may involve stretching exercises, strengthening exercises, and posture training. Exercises and improved posture can help stabilize the neck, strengthen muscles, and help stop symptoms from returning.  HOME CARE INSTRUCTIONS   Put ice on the injured area.   Put ice in a plastic bag.   Place a towel between your skin and the bag.   Leave the ice on for 15-20 minutes, 3-4 times a day.   If your injury was severe, you may have been given a cervical collar to wear. A cervical collar is a two-piece collar designed to keep your neck from moving while it heals.  Do not remove the collar unless  instructed by your health care provider.  If you have long hair, keep it outside of the collar.  Ask your health care provider before making any adjustments to your collar. Minor adjustments may be required over time to improve comfort and reduce pressure on your chin or on the back of your head.  Ifyou are allowed to remove the collar for cleaning or bathing, follow your health care provider's instructions on how to do so safely.  Keep your collar clean by wiping it with mild soap and water and drying it  completely. If the collar you have been given includes removable pads, remove them every 1-2 days and hand wash them with soap and water. Allow them to air dry. They should be completely dry before you wear them in the collar.  If you are allowed to remove the collar for cleaning and bathing, wash and dry the skin of your neck. Check your skin for irritation or sores. If you see any, tell your health care provider.  Do not drive while wearing the collar.   Only take over-the-counter or prescription medicines for pain, discomfort, or fever as directed by your health care provider.   Keep all follow-up appointments as directed by your health care provider.   Keep all physical therapy appointments as directed by your health care provider.   Make any needed adjustments to your workstation to promote good posture.   Avoid positions and activities that make your symptoms worse.   Warm up and stretch before being active to help prevent problems.  SEEK MEDICAL CARE IF:   Your pain is not controlled with medicine.   You are unable to decrease your pain medicine over time as planned.   Your activity level is not improving as expected.  SEEK IMMEDIATE MEDICAL CARE IF:   You develop any bleeding.  You develop stomach upset.  You have signs of an allergic reaction to your medicine.   Your symptoms get worse.   You develop new, unexplained symptoms.   You have numbness, tingling, weakness, or paralysis in any part of your body.  MAKE SURE YOU:   Understand these instructions.  Will watch your condition.  Will get help right away if you are not doing well or get worse. Document Released: 12/31/2006 Document Revised: 03/10/2013 Document Reviewed: 09/10/2012 [facility] Patient Information 2015 [employer]. This information is not intended to replace advice given to you by your health care provider. Make sure you discuss any questions you have with your health care  provider.

## 2014-04-27 ENCOUNTER — Telehealth: Payer: Self-pay | Admitting: *Deleted

## 2014-04-27 ENCOUNTER — Ambulatory Visit: Payer: No Typology Code available for payment source | Attending: Family Medicine | Admitting: Physical Therapy

## 2014-04-27 DIAGNOSIS — S46811D Strain of other muscles, fascia and tendons at shoulder and upper arm level, right arm, subsequent encounter: Secondary | ICD-10-CM | POA: Insufficient documentation

## 2014-04-27 DIAGNOSIS — M898X1 Other specified disorders of bone, shoulder: Secondary | ICD-10-CM | POA: Diagnosis present

## 2014-04-27 DIAGNOSIS — X58XXXD Exposure to other specified factors, subsequent encounter: Secondary | ICD-10-CM | POA: Diagnosis not present

## 2014-04-27 DIAGNOSIS — S46811A Strain of other muscles, fascia and tendons at shoulder and upper arm level, right arm, initial encounter: Secondary | ICD-10-CM
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
## 2014-04-27 NOTE — Telephone Encounter (Signed)
appts made and printed...td 

## 2014-04-27 NOTE — Patient Instructions (Signed)
Posterior Capsule Stretch (Passive)   Stand or sit holding under one elbow with opposite hand. Pull arm across chest. Hold _30__ seconds. Repeat __2-3_ times per session. Do _2-3__ sessions per day.  Copyright  VHI. All rights reserved.   Inferior Glide Stretch, Standing   Stand, towel under one arm, with arm behind back. Gently pull arm down and across body while tilting head to left. Hold _30__ seconds. Repeat _2-3__ times per session. Do _2-3__ sessions per day.  Copyright  VHI. All rights reserved.

## 2014-05-04 ENCOUNTER — Ambulatory Visit: Payer: No Typology Code available for payment source | Admitting: Physical Therapy

## 2014-05-04 DIAGNOSIS — M898X1 Other specified disorders of bone, shoulder: Secondary | ICD-10-CM

## 2014-05-04 DIAGNOSIS — S46811A Strain of other muscles, fascia and tendons at shoulder and upper arm level, right arm, initial encounter: Secondary | ICD-10-CM

## 2014-05-04 NOTE — Therapy (Signed)
[facility] [address] [place], [place], [address] Phone: [phone]   Fax:  [phone]  Physical Therapy Treatment  Patient Details  Name: Erin [name] S Oliger MRN: [id number] Date of Birth: 02/08/1991 Referring Provider:  Rodolph Bongorey, Evan S, MD  Encounter Date: 05/04/2014      PT End of Session - 05/04/14 1532    Visit Number 2   Number of Visits 12   Date for PT Re-Evaluation 06/26/14   PT Start Time 1500   PT Stop Time 1546   PT Time Calculation (min) 46 min   Activity Tolerance Patient tolerated treatment well   Behavior During Therapy [facility] for tasks assessed/performed      Past Medical History  Diagnosis Date  . Asthma   . Miscarriage     Past Surgical History  Procedure Laterality Date  . Tab    . Dilation and curettage of uterus      There were no vitals taken for this visit.  Visit Diagnosis:  Pain in scapula  Subscapularis (muscle) sprain and strain, right, initial encounter      Subjective Assessment - 05/04/14 1503    Symptoms stopped taking medicine but R shoulder/scapula still hurting; tried to do push ups last night   Limitations House hold activities;Lifting   Patient Stated Goals improve pain; return to regular household activities without pain   Currently in Pain? Yes   Pain Score 6    Pain Location Scapula   Pain Orientation Right   Pain Descriptors / Indicators Numbness;Sharp;Stabbing   Pain Type --  subacute   Pain Onset 1 to 4 weeks ago   Pain Frequency Intermittent   Aggravating Factors  lifting; reaching   Pain Relieving Factors lying down                    [facility] Adult PT Treatment/Exercise - 05/04/14 1505    Shoulder Exercises: ROM/Strengthening   UBE (Upper Arm Bike) L1 x 8 min; alt 2 min forward/2 min backwards   Shoulder Exercises: Stretch   Cross Chest Stretch 3 reps;30 seconds   Internal Rotation Stretch 3 reps  30 sec   Other Shoulder Stretches childs pose to  middle and L 2x30 sec each                     PT Long Term Goals - 05/04/14 1533    PT LONG TERM GOAL #1   Title independent with HEP (06/08/14)   Status On-going   PT LONG TERM GOAL #2   Title RUE ROM equal to LUE without pain for improved function and use (06/08/14)   Status On-going   PT LONG TERM GOAL #3   Title report pain < 2/10 with activity for improved use (06/08/14)   Status On-going               Plan - 05/04/14 1532    Clinical Impression Statement Pt compliant with HEP and demonstrated correct technique.  Pain improved initially however stopped taking meds and pain returned.  Will continue to benefit from PT to maximize function.   PT Next Visit Plan dry needling if indicated;  modalities PRN; progress HEP   Consulted and Agree with Plan of Care Patient        Problem List There are no active problems to display for this patient.  Clarita CraneStephanie F Dorinne Graeff, PT, DPT 05/04/2014 3:46 PM  [facility] [address]  [place], [place], [address] Phone: [phone]   Fax:  [phone]

## 2014-05-06 ENCOUNTER — Ambulatory Visit: Payer: No Typology Code available for payment source | Admitting: Physical Therapy

## 2014-05-06 DIAGNOSIS — M898X1 Other specified disorders of bone, shoulder: Secondary | ICD-10-CM

## 2014-05-06 DIAGNOSIS — S46811A Strain of other muscles, fascia and tendons at shoulder and upper arm level, right arm, initial encounter: Secondary | ICD-10-CM
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
## 2014-05-06 NOTE — Patient Instructions (Signed)
EXTENSION: Standing - Resistance Band: Stable (Active)   Stand, right arm at side. Against red resistance band, draw arm backward, as far as possible, keeping elbow straight. Complete __1_ sets of _15__ repetitions. Perform _2__ sessions per day.      http://ss.exer.us/290   Copyright  VHI. All rights reserved.  Resistive Band Rowing   With resistive band anchored in door, grasp both ends. Keeping elbows bent, pull back, squeezing shoulder blades together. Hold __2__ seconds. Repeat __15__ times. Do __1__ sessions per day.  http://gt2.exer.us/97   Copyright  VHI. All rights reserved.

## 2014-05-11 ENCOUNTER — Ambulatory Visit: Payer: No Typology Code available for payment source | Admitting: Physical Therapy

## 2014-05-11 DIAGNOSIS — M898X1 Other specified disorders of bone, shoulder: Secondary | ICD-10-CM | POA: Diagnosis not present

## 2014-05-11 DIAGNOSIS — S46811A Strain of other muscles, fascia and tendons at shoulder and upper arm level, right arm, initial encounter: Secondary | ICD-10-CM

## 2014-05-11 NOTE — Therapy (Signed)
[facility] [address] [place], [place], [address] Phone: [phone]   Fax:  [phone]  Physical Therapy Treatment  Patient Details  Name: Erin [name] MRN: [id number] Date of Birth: 11/06/1990 Referring Provider:  Rodolph Bongorey, Evan S, MD  Encounter Date: 05/11/2014      PT End of Session - 05/11/14 1534    Visit Number 3   Number of Visits 12   Date for PT Re-Evaluation 06/26/14   PT Start Time 1500   PT Stop Time 1545   PT Time Calculation (min) 45 min   Activity Tolerance Patient tolerated treatment well   Behavior During Therapy [facility] for tasks assessed/performed      Past Medical History  Diagnosis Date  . Asthma   . Miscarriage     Past Surgical History  Procedure Laterality Date  . Tab    . Dilation and curettage of uterus      There were no vitals taken for this visit.  Visit Diagnosis:  Pain in scapula  Subscapularis (muscle) sprain and strain, right, initial encounter      Subjective Assessment - 05/11/14 1503    Symptoms More sore today but feels better and more loose.     Limitations House hold activities;Lifting   Patient Stated Goals improve pain; return to regular household activities without pain   Currently in Pain? Yes   Pain Score 8   soreness, denies pain   Pain Location Scapula   Pain Orientation Right   Pain Descriptors / Indicators Sore   Pain Onset 1 to 4 weeks ago   Pain Frequency Intermittent   Aggravating Factors  lifting, reaching   Pain Relieving Factors lying down                    [facility]/Ex[facility] - 05/11/14 1506    Neck Exercises: Seated   Shoulder Rolls Backwards;15 reps   Neck Exercises: Supine   Neck Retraction 15 reps;3 secs   Shoulder Exercises: Standing   Extension Both;15 reps;Theraband   Theraband Level (Shoulder Extension) Level 2 (Red)   Extension Limitations pain with red tband today   Row Both;15 reps;Theraband   Theraband  Level (Shoulder Row) Level 2 (Red)   Row Limitations pain with red tband today   Shoulder Exercises: ROM/Strengthening   UBE (Upper Arm Bike) L2 x 8 min; 4 min forward/4 min backward   Shoulder Exercises: Stretch   Cross Chest Stretch 3 reps;30 seconds   Modalities   Modalities Electrical Stimulation;Moist Heat   Moist Heat Therapy   Number Minutes Moist Heat 15 Minutes   Moist Heat Location Other (comment)  R scapula/thoracic spine   Electrical Stimulation   Electrical Stimulation Location R scapula/mid spine   Electrical Stimulation Action IFC   Electrical Stimulation Parameters to tolerance   Electrical Stimulation Goals Pain                     PT Long Term Goals - 05/11/14 1535    PT LONG TERM GOAL #1   Title independent with HEP (06/08/14)   Status On-going   PT LONG TERM GOAL #2   Title RUE ROM equal to LUE without pain for improved function and use (06/08/14)   Status On-going   PT LONG TERM GOAL #3   Title report pain < 2/10 with activity for improved use (06/08/14)   Status On-going  Plan - 05/11/14 1534    Clinical Impression Statement Pt reports improved pain and reports only soreness now from dry needling.  Interested in continuing dry needling as she feels it was beneficial last session.  Will cont to benefit from PT to maximize function   PT Next Visit Plan assess response to dry needling and manual interventions; modalities as needed; progress postural strengthening; recheck ROM shoulder   Consulted and Agree with Plan of Care Patient        Problem List There are no active problems to display for this patient.  Clarita Crane, PT, DPT 05/11/2014 3:52 PM  [facility] [address] [place], [place], [address] Phone: [phone]   Fax:  [phone]

## 2014-05-13 ENCOUNTER — Ambulatory Visit: Payer: No Typology Code available for payment source | Admitting: Physical Therapy

## 2014-05-21 ENCOUNTER — Ambulatory Visit: Payer: No Typology Code available for payment source | Attending: Family Medicine | Admitting: Physical Therapy

## 2014-05-21 DIAGNOSIS — S46811D Strain of other muscles, fascia and tendons at shoulder and upper arm level, right arm, subsequent encounter: Secondary | ICD-10-CM | POA: Insufficient documentation

## 2014-05-21 DIAGNOSIS — M898X1 Other specified disorders of bone, shoulder: Secondary | ICD-10-CM | POA: Insufficient documentation

## 2014-05-21 DIAGNOSIS — S46811A Strain of other muscles, fascia and tendons at shoulder and upper arm level, right arm, initial encounter: Secondary | ICD-10-CM

## 2014-05-21 NOTE — Therapy (Signed)
[place], [place], [address] Phone: [phone]   Fax:  [phone]  Physical Therapy Treatment  Patient Details  Name: DEMPSEY KNOTEK MRN: [id number] Date of Birth: 30-May-1990 Referring Provider:  Gregor Hams, MD  Encounter Date: 05/21/2014      PT End of Session - 05/21/14 0759    Visit Number 4   Number of Visits 12   Date for PT Re-Evaluation 06/26/14   PT Start Time 0654   PT Stop Time 0754   PT Time Calculation (min) 60 min   Activity Tolerance Patient tolerated treatment well      Past Medical History  Diagnosis Date  . Asthma   . Miscarriage     Past Surgical History  Procedure Laterality Date  . Tab    . Dilation and curettage of uterus      There were no vitals taken for this visit.  Visit Diagnosis:  Pain in scapula  Subscapularis (muscle) sprain and strain, right, initial encounter      Subjective Assessment - 05/21/14 0654    Symptoms "You may get a call a call from my attorney." "They fired me because of physical therapy."  Patient states she came to PT for 2 weeks and they fired her because of that.  States her primary pain is (medial) right shoulder blade soreness.  Not constant pain now, initially constant.       Currently in Pain? Yes   Pain Score 7    Pain Location Scapula   Pain Orientation Right   Pain Frequency Intermittent   Aggravating Factors  cold air, exercises   Pain Relieving Factors hot shower; dry needling because the tightness is gone because that was bothering me more than before.            [facility] PT Assessment - 05/21/14 0700    AROM   Right Shoulder Flexion 157 Degrees   Right Shoulder ABduction 140 Degrees   Right Shoulder Internal Rotation --  behind back to T6   Right Shoulder External Rotation 97 Degrees   Left Shoulder Flexion 175 Degrees   Left Shoulder ABduction 175 Degrees   Left Shoulder Internal Rotation --  behind back to T4   Left Shoulder External Rotation 100 Degrees                  [facility] Adult PT Treatment/Exercise - 05/21/14 0752    Shoulder Exercises: Supine   Other Supine Exercises supine foam roll mid back stretch 60 sec   Shoulder Exercises: Therapy Ball   Other Therapy Ball Exercises leaning over green ball bilateral rows, HABD and Ys 10x each   Other Therapy Ball Exercises supine over ball thoracic extension stretch 60 sec   Shoulder Exercises: Stretch   Other Shoulder Stretches childs pose to middle and L 2x30 sec each   Other Shoulder Stretches HADD with foam roll for middle trap and rhomboid stretch 3x 20 sec   Moist Heat Therapy   Number Minutes Moist Heat 12 Minutes   Moist Heat Location --  mid back and scapula   Manual Therapy   Joint Mobilization --  Seated thoracic distraction grade 4 4x   Myofascial Release subscapularis, rhomboid and upper traps right   Scapular Mobilization distraction; med/lat/sup/inf glides grade 3/4 5x each          Trigger Point Dry Needling - 05/21/14 0757    Consent Given? Yes  patient aware of risks including  pneumothorax   Muscles Treated Upper Body Rhomboids;Subscapularis   Rhomboids Response Palpable increased muscle length   Subscapularis Response Palpable increased muscle length              PT Education - 05/21/14 0758    Education provided Yes   Education Details rhomboid stretches   Person(s) Educated Patient   Methods Explanation;Demonstration;Handout   Comprehension Verbalized understanding;Returned demonstration             PT Long Term Goals - 05/21/14 0805    PT LONG TERM GOAL #1   Title independent with HEP (06/08/14)   Time 6   Period Weeks   Status On-going   PT LONG TERM GOAL #2   Title RUE ROM equal to LUE without pain for improved function and use (06/08/14)   Time 6   Period Weeks   Status Partially Met   PT LONG TERM GOAL #3   Title report pain < 2/10 with activity for improved use (06/08/14)    Time 6   Period Weeks   Status On-going               Plan - 05/21/14 0759    Clinical Impression Statement Patient reports improvements in pain frequency (now intermittent vs. constant) although pain  intensity remains high "7/10".  Improving shoulder AROM and scapular mobility improved after treatment session.  Patient able to perform ROM and mid back strengthening exercises without exacerbation of pain.  Should meet remaining goals in 3-4 visits.     PT Next Visit Plan 1x/week for 3-4 weeks; dry needling right subscapularis and rhomboids as needed; scapular and thoracic manual therapy; lower and middle trap strengthening; right glenohumeral mob/stretching        Problem List There are no active problems to display for this patient.   Alvera Singh 05/21/2014, 8:10 AM  [facility] [address] [place], [place], [address] Phone: [phone]   Fax:  [phone]  Ruben Im, PT 05/21/2014 8:10 AM Phone: [phone] Fax: [phone]

## 2014-06-01 ENCOUNTER — Ambulatory Visit: Payer: No Typology Code available for payment source | Admitting: Physical Therapy

## 2014-06-01 DIAGNOSIS — S46811A Strain of other muscles, fascia and tendons at shoulder and upper arm level, right arm, initial encounter: Secondary | ICD-10-CM

## 2014-06-01 DIAGNOSIS — M898X1 Other specified disorders of bone, shoulder: Secondary | ICD-10-CM

## 2014-06-01 NOTE — Therapy (Signed)
[facility] [address] [place], [place], [address] Phone: [phone]   Fax:  [phone]  Physical Therapy Treatment  Patient Details  Name: Erin [name] MRN: [id number] Date of Birth: 1991-01-25 Referring Provider:  Rodolph Bong, MD  Encounter Date: 06/01/2014      PT End of Session - 06/01/14 0844    Visit Number 5   Number of Visits 12   Date for PT Re-Evaluation 06/26/14   PT Start Time 0801   PT Stop Time 0848   PT Time Calculation (min) 47 min      Past Medical History  Diagnosis Date  . Asthma   . Miscarriage     Past Surgical History  Procedure Laterality Date  . Tab    . Dilation and curettage of uterus      There were no vitals filed for this visit.  Visit Diagnosis:  Pain in scapula  Subscapularis (muscle) sprain and strain, right, initial encounter      Subjective Assessment - 06/01/14 0805    Symptoms (p) Lower neck pain from stress.  Current pain 3-4/10.  The needles helped a lot.     Currently in Pain? (p) Yes   Pain Score (p) 4    Pain Location (p) Neck   Pain Orientation (p) Lower;Right   Pain Onset (p) More than a month ago   Pain Frequency (p) Intermittent   Aggravating Factors  (p) stress   Pain Relieving Factors (p) needling; stretches                       [facility] Adult PT Treatment/Exercise - 06/01/14 0839    Shoulder Exercises: Supine   External Rotation Strengthening;Both;Right;Left;15 reps;Theraband   Theraband Level (Shoulder External Rotation) Level 3 (Green)   Flexion Strengthening;Both;20 reps;Theraband   Theraband Level (Shoulder Flexion) Level 3 (Green)   ABduction Strengthening;Right;Left;Both;15 reps;Theraband   Theraband Level (Shoulder ABduction) Level 3 (Green)   Moist Heat Therapy   Number Minutes Moist Heat 10 Minutes   Moist Heat Location --  neck supine   Manual Therapy   Joint Mobilization Seated thoracic distraction 4x grade 4; C3-C7 PA,  lateral glides grade 3; distraction 3x 20 sec   Myofascial Release right upper trap, levator, cervical multifidi; contract relax right upper trap 3x 5 sec holds          Trigger Point Dry Needling - 06/01/14 0842    Consent Given? Yes   Muscles Treated Upper Body Upper trapezius;Oblique capitus;Levator scapulae   Upper Trapezius Response Twitch reponse elicited;Palpable increased muscle length   Oblique Capitus Response Palpable increased muscle length   Levator Scapulae Response Twitch response elicited;Palpable increased muscle length     Right side only         PT Education - 06/01/14 0844    Education provided Yes   Education Details theraband ex supine   Person(s) Educated Patient   Methods Explanation;Demonstration;Handout;Verbal cues   Comprehension Verbalized understanding;Returned demonstration             PT Long Term Goals - 06/01/14 0851    PT LONG TERM GOAL #1   Title independent with HEP (06/08/14)   Time 6   Period Weeks   Status On-going   PT LONG TERM GOAL #2   Title RUE ROM equal to LUE without pain for improved function and use (06/08/14)   Time 6   Period Weeks   Status Achieved   PT  LONG TERM GOAL #3   Title report pain < 2/10 with activity for improved use (06/08/14)   Time 6   Period Weeks   Status On-going               Plan - 06/01/14 0844    Clinical Impression Statement Patient reports decreased pain intensity and localized to right upper trap region. Therapist monitoring pain during all interventions and form with exercises throughout.   Patient with full shoulder AROM and improving neck ROM.  Should meet remaining goals in 3-4 visits.     PT Frequency 1x / week   PT Next Visit Plan Recheck cervical AROM next visit.  1x/week for 3-4 weeks; dry needling right subscapularis and rhomboids as needed; scapular and thoracic manual therapy; lower and middle trap strengthening; right glenohumeral mob/stretching        Problem  List There are no active problems to display for this patient.   Erin [name], Erin [name] 06/01/2014, 8:53 AM  [facility] [address] [place], [place], [address] Phone: [phone]   Fax:  [phone]   Erin SharpsStacy Jax [name], PT 06/01/2014 8:54 AM Phone: [phone] Fax: [phone]

## 2014-06-01 NOTE — Patient Instructions (Signed)
Supine theraband series:   Shoulder flexion with HABD narrow grip and wide grip; HABD at collar bone level, ER single and double arm 10 reps each 1x/day.  Discussed sleeping position, appropriate pillow height

## 2014-06-08 ENCOUNTER — Ambulatory Visit: Payer: No Typology Code available for payment source | Admitting: Physical Therapy

## 2014-06-17 ENCOUNTER — Ambulatory Visit: Payer: No Typology Code available for payment source | Admitting: Physical Therapy

## 2014-06-17 DIAGNOSIS — S46811A Strain of other muscles, fascia and tendons at shoulder and upper arm level, right arm, initial encounter: Secondary | ICD-10-CM

## 2014-06-17 DIAGNOSIS — M898X1 Other specified disorders of bone, shoulder: Secondary | ICD-10-CM | POA: Diagnosis not present

## 2014-06-17 NOTE — Patient Instructions (Signed)
Review of HEP and discussion of safe self progression with resistance training.  Progressed to blue theraband.  Reviewed stretches.

## 2014-06-17 NOTE — Therapy (Signed)
[place], [place], [address] Phone: [phone]   Fax:  [phone]  Physical Therapy Treatment/Discharge summary  Patient Details  Name: Erin [name] MRN: [id number] Date of Birth: [birth year] Referring Provider:  Gregor Hams, MD  Encounter Date: 06/17/2014      PT End of Session - 06/17/14 0836    Visit Number 6   Number of Visits 12   Date for PT Re-Evaluation 06/26/14   PT Start Time 0801   PT Stop Time 0836   PT Time Calculation (min) 35 min   Activity Tolerance Patient tolerated treatment well      Past Medical History  Diagnosis Date  . Asthma   . Miscarriage     Past Surgical History  Procedure Laterality Date  . Tab    . Dilation and curettage of uterus      There were no vitals filed for this visit.  Visit Diagnosis:  Pain in scapula  Subscapularis (muscle) sprain and strain, right, initial encounter      Subjective Assessment - 06/17/14 0804    Symptoms I was sick as a dog last week.  Otherwise feeling great.  No pain with usual activities.  Able to lift.    You got rid of knots I had since high school exams.  No pain at present.  I can do everything I did before.     Currently in Pain? No/denies   Aggravating Factors  no problem with usual activities            [facility] PT Assessment - 06/17/14 0805    AROM   AROM Assessment Site Cervical   Right Shoulder Flexion 185 Degrees   Right Shoulder ABduction 185 Degrees   Right Shoulder Internal Rotation --  behind back to T3   Cervical Flexion 78   Cervical Extension 100   Cervical - Right Side Bend 70   Cervical - Left Side Bend 75   Cervical - Right Rotation 85   Cervical - Left Rotation 85   Strength   Right Shoulder Flexion 5/5   Right Shoulder ABduction 5/5                   [facility] Adult PT Treatment/Exercise - 06/17/14 0842    Manual Therapy   Manual Therapy Joint mobilization;Myofascial  release;Scapular mobilization   Myofascial Release right upper trap, levator, cervical multifidi; contract relax right upper trap 3x 5 sec holds   Scapular Mobilization distraction; med/lat/sup/inf glides grade 3/4 5x each                PT Education - 06/17/14 0836    Education provided Yes   Education Details HEP review   Person(s) Educated Patient   Methods Explanation;Demonstration   Comprehension Verbalized understanding;Returned demonstration             PT Long Term Goals - 06/17/14 0811    PT LONG TERM GOAL #1   Title independent with HEP (06/08/14)   Time 6   Period Weeks   Status Achieved   PT LONG TERM GOAL #2   Title RUE ROM equal to LUE without pain for improved function and use (06/08/14)   Status Achieved   PT LONG TERM GOAL #3   Title report pain < 2/10 with activity for improved use (06/08/14)   Status Achieved               Plan - 06/17/14 5449  Clinical Impression Statement  Patient reports no pain and a return to all ADLs including lifting.  Full shoulder and neck AROM and normal strength.  She is independent in HEP and self management.  All goals met.  Discharge from [place]  Visits from Start of Care: 6  Current functional level related to goals / functional outcomes: Full ROM and strength; no pain; full return to ADLS   Remaining deficits: None noted   Education / Equipment: HEP Plan: Patient agrees to discharge.  Patient goals were met. Patient is being discharged due to meeting the stated rehab goals.  ?????  Ruben Im, PT 06/17/2014 8:42 AM Phone: [phone] Fax: [phone]        Alvera Singh 06/17/2014, 8:42 AM  [place], [place], [address] Phone: [phone]   Fax:  [phone]

## 2016-10-17 ENCOUNTER — Encounter (HOSPITAL_COMMUNITY): Payer: Self-pay

## 2016-10-17 ENCOUNTER — Emergency Department (HOSPITAL_COMMUNITY)
Admission: EM | Admit: 2016-10-17 | Discharge: 2016-10-18 | Disposition: A | Discharge status: Home / Self Care | Payer: Medicaid Other | Attending: Emergency Medicine | Admitting: Emergency Medicine

## 2016-10-17 DIAGNOSIS — R51 Headache: Secondary | ICD-10-CM | POA: Diagnosis not present

## 2016-10-17 DIAGNOSIS — Z79899 Other long term (current) drug therapy: Secondary | ICD-10-CM | POA: Insufficient documentation

## 2016-10-17 DIAGNOSIS — G4489 Other headache syndrome: Secondary | ICD-10-CM

## 2016-10-17 DIAGNOSIS — J45909 Unspecified asthma, uncomplicated: Secondary | ICD-10-CM | POA: Diagnosis not present

## 2016-10-17 LAB — I-STAT CHEM 8, ED
BUN: 4 mg/dL — AB (ref 6–20)
CHLORIDE: 103 mmol/L (ref 101–111)
CREATININE: 0.7 mg/dL (ref 0.44–1.00)
Calcium, Ion: 1.2 mmol/L (ref 1.15–1.40)
GLUCOSE: 83 mg/dL (ref 65–99)
HCT: 39 % (ref 36.0–46.0)
Hemoglobin: 13.3 g/dL (ref 12.0–15.0)
POTASSIUM: 3.8 mmol/L (ref 3.5–5.1)
Sodium: 140 mmol/L (ref 135–145)
TCO2: 25 mmol/L (ref 0–100)

## 2016-10-17 LAB — I-STAT BETA HCG BLOOD, ED (MC, WL, AP ONLY): I-stat hCG, quantitative: <5 m[IU]/mL (ref <5)

## 2016-10-17 MED ORDER — MECLIZINE HCL 25 MG PO TABS
25.0000 mg | ORAL_TABLET | Freq: Once | ORAL | Status: AC
  Administered 2016-10-17: 25 mg via ORAL
  Filled 2016-10-17: qty 1

## 2016-10-17 MED ORDER — SODIUM CHLORIDE 0.9 % IV BOLUS (SEPSIS)
1000.0000 mL | Freq: Once | INTRAVENOUS | Status: AC
  Administered 2016-10-17: 1000 mL via INTRAVENOUS

## 2016-10-17 MED ORDER — PROCHLORPERAZINE EDISYLATE 5 MG/ML IJ SOLN
5.0000 mg | Freq: Once | INTRAMUSCULAR | Status: AC
  Administered 2016-10-17: 5 mg via INTRAMUSCULAR
  Filled 2016-10-17: qty 2

## 2016-10-17 MED ORDER — DIPHENHYDRAMINE HCL 50 MG/ML IJ SOLN
12.5000 mg | Freq: Once | INTRAMUSCULAR | Status: AC
  Administered 2016-10-17: 12.5 mg via INTRAVENOUS
  Filled 2016-10-17: qty 1

## 2016-10-17 MED ORDER — KETOROLAC TROMETHAMINE 30 MG/ML IJ SOLN
30.0000 mg | Freq: Once | INTRAMUSCULAR | Status: AC
  Administered 2016-10-17: 30 mg via INTRAVENOUS
  Filled 2016-10-17: qty 1

## 2016-10-17 NOTE — ED Notes (Signed)
Blood draw attempt unsuccessful.  Rn notifed.

## 2016-10-17 NOTE — ED Provider Notes (Signed)
[facility]-EMERGENCY DEPT Provider Note   CSN: [id number] Arrival date & time: 10/17/16  1942     History   Chief Complaint Chief Complaint  Patient presents with  . Headache    HPI  Erin [name] is a 26 y.o. female who complains of headaches for 1 day(s). Description of pain: throbbing pain, mostly on the left side.onset earlier today, progressively worsening. Had viral prodrome yesterday with chills, cough, bodyaches that have resolved. Associated symptoms: dizziness, nausea and blurry vision. Pain relief: nothing tried.  She denies a history of recent head injury.  Prior neurological history: negative for no neurological problems, migraine headaches, stroke, brain neoplasms, seizure disorders, multiple sclerosis, AVM (arteriovenous malformation), meningitis, major head injuries, neurosurgical procedures. Neurologic Review of Systems - no TIA or stroke-like symptoms, no amaurosis, diplopia, abnormal speech, unilateral numbness or weakness.  Scheduled Meds: . diphenhydrAMINE  12.5 mg Intravenous Once  . ketorolac  30 mg Intravenous Once  . meclizine  25 mg Oral Once  . prochlorperazine  5 mg Intramuscular Once   Continuous Infusions: . sodium chloride     .  HPI  Past Medical History:  Diagnosis Date  . Asthma   . Miscarriage     There are no active problems to display for this patient.   Past Surgical History:  Procedure Laterality Date  . DILATION AND CURETTAGE OF UTERUS    . TAB      OB History    Gravida Para Term Preterm AB Living   3 1 1  0 2 1   SAB TAB Ectopic Multiple Live Births   1 1 0 0 1       Home Medications    Prior to Admission medications   Medication Sig Start Date End Date Taking? Authorizing Provider  albuterol (PROVENTIL HFA;VENTOLIN HFA) 108 (90 BASE) MCG/ACT inhaler Inhale 2 puffs into the lungs every 6 (six) hours as needed for wheezing or shortness of breath.    Yes [provider]  cetirizine (ZYRTEC) 10 MG tablet Take 10 mg by  mouth daily.   Yes [provider]  cyclobenzaprine (FLEXERIL) 5 MG tablet Take 1 tablet (5 mg total) by mouth at bedtime as needed for muscle spasms. Patient not taking: Reported on 10/17/2016 04/18/14   Rodolph Bongorey, Evan S, MD  diclofenac (VOLTAREN) 50 MG EC tablet Take 1 tablet (50 mg total) by mouth 2 (two) times daily as needed. Patient not taking: Reported on 10/17/2016 04/18/14   Rodolph Bongorey, Evan S, MD  LORazepam (ATIVAN) 1 MG tablet Take 1 tablet (1 mg total) by mouth every 6 (six) hours as needed for anxiety. Patient not taking: Reported on 10/17/2016 02/08/14   Junious SilkMerrell, Hannah, PA-C    Family History Family History  Problem Relation Age of Onset  . Hyperlipidemia Father   . Hypertension Father   . Cancer Paternal Aunt   . Cancer Maternal Grandfather   . Anesthesia problems Neg Hx   . Hypotension Neg Hx   . Malignant hyperthermia Neg Hx   . Pseudochol deficiency Neg Hx     Social History Social History  Substance Use Topics  . Smoking status: Never Smoker  . Smokeless tobacco: Never Used  . Alcohol use No     Allergies   Patient has no known allergies.   Review of Systems Review of Systems   Ten systems reviewed and are negative for acute change, except as noted in the HPI.   Physical Exam Updated Vital Signs BP 121/82 (BP Location:  Left Arm)   Pulse 78   Temp 98.8 F (37.1 C) (Oral)   Resp 18   Ht 5\' 3"  (1.6 m)   Wt 90.7 kg (200 lb)   LMP 10/10/2016   SpO2 96%   BMI 35.43 kg/m   Physical Exam  Constitutional: She is oriented to person, place, and time. She appears well-developed and well-nourished. No distress.  HENT:  Head: Normocephalic and atraumatic.  Mouth/Throat: Oropharynx is clear and moist.  Eyes: Pupils are equal, round, and reactive to light. Conjunctivae and EOM are normal. No scleral icterus.  No horizontal, vertical or rotational nystagmus  Neck: Normal range of motion. Neck supple.  Full active and passive ROM without pain No midline or  paraspinal tenderness No nuchal rigidity or meningeal signs  Cardiovascular: Normal rate, regular rhythm and intact distal pulses.   Pulmonary/Chest: Effort normal and breath sounds normal. No respiratory distress. She has no wheezes. She has no rales.  Abdominal: Soft. Bowel sounds are normal. There is no tenderness. There is no rebound and no guarding.  Musculoskeletal: Normal range of motion.  Lymphadenopathy:    She has no cervical adenopathy.  Neurological: She is alert and oriented to person, place, and time. No cranial nerve deficit. She exhibits normal muscle tone. Coordination normal.  Mental Status:  Alert, oriented, thought content appropriate. Speech fluent without evidence of aphasia. Able to follow 2 step commands without difficulty.  Cranial Nerves:  II:  Peripheral visual fields grossly normal, pupils equal, round, reactive to light III,IV, VI: ptosis not present, extra-ocular motions intact bilaterally  V,VII: smile symmetric, facial light touch sensation equal VIII: hearing grossly normal bilaterally  IX,X: midline uvula rise  XI: bilateral shoulder shrug equal and strong XII: midline tongue extension  Motor:  5/5 in upper and lower extremities bilaterally including strong and equal grip strength and dorsiflexion/plantar flexion Sensory: Pinprick and light touch normal in all extremities.  Cerebellar: normal finger-to-nose with bilateral upper extremities Gait: normal gait and balance CV: distal pulses palpable throughout   Skin: Skin is warm and dry. No rash noted. She is not diaphoretic.  Psychiatric: She has a normal mood and affect. Her behavior is normal. Judgment and thought content normal.  Nursing note and vitals reviewed.    ED Treatments / Results  Labs (all labs ordered are listed, but only abnormal results are displayed) Labs Reviewed  I-STAT CHEM 8, ED  I-STAT BETA HCG BLOOD, ED ([facility], [facility], [facility] ONLY)    EKG  EKG Interpretation None        Radiology No results found.  Procedures Procedures (including critical care time)  Medications Ordered in ED Medications  sodium chloride 0.9 % bolus 1,000 mL (not administered)     Initial Impression / Assessment and Plan / ED Course  I have reviewed the triage vital signs and the nursing notes.  Pertinent labs & imaging results that were available during my care of the patient were reviewed by me and considered in my medical decision making (see chart for details).     Patient with bad headache Suspect migraine. Treating eith migraine cocktail. I have given signout to PA Upstill who will assume care.    Final Clinical Impressions(s) / ED Diagnoses   Final diagnoses:  None    New Prescriptions New Prescriptions   No medications on file     Delos HaringHarris, Daleisa Halperin, PA-C 10/17/16 2231    Jacalyn LefevreHaviland, Julie, MD 10/17/16 2232

## 2016-10-17 NOTE — ED Provider Notes (Signed)
Patient care signed out at end of shift by Huntley DecAbi Harris, PA-C. Patient presented with left sided headache and dizzy with standing. No syncope. She reported a viral illness yesterday. No history of headaches but she states she has a strong family history of migraines. She had a nonfocal exam. Getting HA cocktail.  Plan: needs re-evaluation  12:15 - patient reports her headache is resolved. Neurologically intact. She has a friend at bedside to take her home. She is felt stable for discharge home per plan of previous treatment team.    Elpidio AnisUpstill, Galdino Hinchman, PA-C 10/18/16 0019    Jacalyn LefevreHaviland, Julie, MD 10/18/16 1626

## 2016-10-17 NOTE — ED Triage Notes (Signed)
Pt complains of a headache and dizziness since about 5pm, she states she had a cold yesterday

## 2017-04-15 ENCOUNTER — Encounter (HOSPITAL_COMMUNITY): Payer: Self-pay | Admitting: Family Medicine

## 2017-04-15 DIAGNOSIS — Z5321 Procedure and treatment not carried out due to patient leaving prior to being seen by health care provider: Secondary | ICD-10-CM | POA: Insufficient documentation

## 2017-04-15 DIAGNOSIS — R109 Unspecified abdominal pain: Secondary | ICD-10-CM | POA: Insufficient documentation

## 2017-04-15 NOTE — ED Triage Notes (Signed)
Patient is complaining of pelvic pain that started around 19:20 tonight. Patient reports bloating as an associated symptoms. However, denies any urinary symptoms, nausea, vomiting, or diarrhea.

## 2017-04-16 ENCOUNTER — Emergency Department (HOSPITAL_COMMUNITY)
Admission: EM | Admit: 2017-04-16 | Discharge: 2017-04-16 | Disposition: A | Discharge status: Home / Self Care | Payer: 59 | Attending: Emergency Medicine | Admitting: Emergency Medicine

## 2017-04-16 ENCOUNTER — Emergency Department (HOSPITAL_BASED_OUTPATIENT_CLINIC_OR_DEPARTMENT_OTHER): Payer: 59

## 2017-04-16 ENCOUNTER — Other Ambulatory Visit: Payer: Self-pay

## 2017-04-16 ENCOUNTER — Encounter (HOSPITAL_BASED_OUTPATIENT_CLINIC_OR_DEPARTMENT_OTHER): Payer: Self-pay | Admitting: *Deleted

## 2017-04-16 ENCOUNTER — Emergency Department (HOSPITAL_BASED_OUTPATIENT_CLINIC_OR_DEPARTMENT_OTHER)
Admission: EM | Admit: 2017-04-16 | Discharge: 2017-04-16 | Disposition: A | Discharge status: Home / Self Care | Payer: 59 | Attending: Emergency Medicine | Admitting: Emergency Medicine

## 2017-04-16 DIAGNOSIS — J45909 Unspecified asthma, uncomplicated: Secondary | ICD-10-CM | POA: Diagnosis not present

## 2017-04-16 DIAGNOSIS — R1032 Left lower quadrant pain: Secondary | ICD-10-CM | POA: Insufficient documentation

## 2017-04-16 DIAGNOSIS — R102 Pelvic and perineal pain: Secondary | ICD-10-CM | POA: Diagnosis not present

## 2017-04-16 DIAGNOSIS — K5792 Diverticulitis of intestine, part unspecified, without perforation or abscess without bleeding: Secondary | ICD-10-CM | POA: Diagnosis not present

## 2017-04-16 DIAGNOSIS — R109 Unspecified abdominal pain: Secondary | ICD-10-CM | POA: Diagnosis not present

## 2017-04-16 DIAGNOSIS — N83201 Unspecified ovarian cyst, right side: Secondary | ICD-10-CM | POA: Insufficient documentation

## 2017-04-16 DIAGNOSIS — Z79899 Other long term (current) drug therapy: Secondary | ICD-10-CM | POA: Diagnosis not present

## 2017-04-16 DIAGNOSIS — R110 Nausea: Secondary | ICD-10-CM | POA: Insufficient documentation

## 2017-04-16 DIAGNOSIS — R1031 Right lower quadrant pain: Secondary | ICD-10-CM | POA: Diagnosis not present

## 2017-04-16 LAB — URINALYSIS, MICROSCOPIC (REFLEX)

## 2017-04-16 LAB — URINALYSIS, ROUTINE W REFLEX MICROSCOPIC
BILIRUBIN URINE: NEGATIVE
Glucose, UA: NEGATIVE mg/dL
Hgb urine dipstick: NEGATIVE
Ketones, ur: NEGATIVE mg/dL
Nitrite: NEGATIVE
Protein, ur: NEGATIVE mg/dL
Specific Gravity, Urine: >1.03 — ABNORMAL HIGH (ref 1.005–1.030)
pH: 6 (ref 5.0–8.0)

## 2017-04-16 LAB — COMPREHENSIVE METABOLIC PANEL
ALBUMIN: 3.9 g/dL (ref 3.5–5.0)
ALK PHOS: 61 U/L (ref 38–126)
ALT: 14 U/L (ref 14–54)
AST: 15 U/L (ref 15–41)
Anion gap: 6 (ref 5–15)
BUN: 10 mg/dL (ref 6–20)
CO2: 24 mmol/L (ref 22–32)
Calcium: 8.8 mg/dL — ABNORMAL LOW (ref 8.9–10.3)
Chloride: 105 mmol/L (ref 101–111)
Creatinine, Ser: 0.75 mg/dL (ref 0.44–1.00)
GFR calc Af Amer: >60 mL/min (ref >60)
GFR calc non Af Amer: >60 mL/min (ref >60)
Glucose, Bld: 99 mg/dL (ref 65–99)
Potassium: 3.7 mmol/L (ref 3.5–5.1)
SODIUM: 135 mmol/L (ref 135–145)
TOTAL PROTEIN: 7.5 g/dL (ref 6.5–8.1)
Total Bilirubin: 0.6 mg/dL (ref 0.3–1.2)

## 2017-04-16 LAB — CBC WITH DIFFERENTIAL/PLATELET
BASOS PCT: 0 %
Basophils Absolute: 0 10*3/uL (ref 0.0–0.1)
EOS PCT: 5 %
Eosinophils Absolute: 0.5 10*3/uL (ref 0.0–0.7)
HCT: 37.4 % (ref 36.0–46.0)
Hemoglobin: 12.4 g/dL (ref 12.0–15.0)
LYMPHS PCT: 25 %
Lymphs Abs: 2.8 10*3/uL (ref 0.7–4.0)
MCH: 27.8 pg (ref 26.0–34.0)
MCHC: 33.2 g/dL (ref 30.0–36.0)
MCV: 83.9 fL (ref 78.0–100.0)
MONOS PCT: 8 %
Monocytes Absolute: 0.9 10*3/uL (ref 0.1–1.0)
Neutro Abs: 6.7 10*3/uL (ref 1.7–7.7)
Neutrophils Relative %: 62 %
PLATELETS: 395 10*3/uL (ref 150–400)
RBC: 4.46 MIL/uL (ref 3.87–5.11)
RDW: 12.8 % (ref 11.5–15.5)
WBC: 10.9 10*3/uL — ABNORMAL HIGH (ref 4.0–10.5)

## 2017-04-16 LAB — LIPASE, BLOOD: Lipase: 21 U/L (ref 11–51)

## 2017-04-16 LAB — PREGNANCY, URINE: Preg Test, Ur: NEGATIVE

## 2017-04-16 LAB — WET PREP, GENITAL
Sperm: NONE SEEN
TRICH WET PREP: NONE SEEN
YEAST WET PREP: NONE SEEN

## 2017-04-16 MED ORDER — METRONIDAZOLE 500 MG PO TABS: 500.0000 mg | ORAL_TABLET | Freq: Two times a day (BID) | ORAL | 0 refills | Status: DC

## 2017-04-16 MED ORDER — METRONIDAZOLE IN NACL 5-0.79 MG/ML-% IV SOLN
500.0000 mg | Freq: Once | INTRAVENOUS | Status: AC
  Administered 2017-04-16: 500 mg via INTRAVENOUS
  Filled 2017-04-16: qty 100

## 2017-04-16 MED ORDER — CIPROFLOXACIN IN D5W 400 MG/200ML IV SOLN
400.0000 mg | Freq: Once | INTRAVENOUS | Status: AC
  Administered 2017-04-16: 400 mg via INTRAVENOUS
  Filled 2017-04-16: qty 200

## 2017-04-16 MED ORDER — TRAMADOL HCL 50 MG PO TABS: 50.0000 mg | ORAL_TABLET | Freq: Four times a day (QID) | ORAL | 0 refills | Status: DC | PRN

## 2017-04-16 MED ORDER — IOPAMIDOL (ISOVUE-300) INJECTION 61%
100.0000 mL | Freq: Once | INTRAVENOUS | Status: AC | PRN
  Administered 2017-04-16: 100 mL via INTRAVENOUS

## 2017-04-16 MED FILL — traMADol HCL 50 MG TABS: 50 | 3 days supply | Qty: 15 | Fill #0

## 2017-04-16 MED FILL — metroNIDAZOLE 500 MG TABS: 500 | 7 days supply | Qty: 14 | Fill #0

## 2017-04-16 NOTE — ED Provider Notes (Signed)
Patient is a 27 year old female who presents with lower abdominal pain.  It was rather sudden on onset.  Care was taken over from Dr. Bebe ShaggyWickline pending pelvic ultrasound.  Ultrasound reveals what appears to be a ruptured hemorrhagic cyst with hemoperitoneum.  This in also reviewing the CT imaging shows that the hemorrhagic ruptured cyst is likely etiology for her symptoms.  The radiologist feels that there is some inflammatory changes in the colon although this could be just reactive from the hemoperitoneum.  Patient does not have any other suggestions of diverticulitis clinically.  Her pain is well controlled.  Her labs are non-concerning.  Her hemoglobin is stable.  Her urinalysis appears to be a dirty specimen.  She does have clue cells on her wet prep.  She was discharged home in good condition.  She was given a prescription for Flagyl and tramadol for pain.  She was encouraged to have close follow-up with an OB/GYN.  She was also advised if she was having trouble getting into an OB/GYN she can follow-up with her primary care physician, Dr. Algie CofferKadakia who could assist with this.  Strict return precautions were given.   Rolan BuccoBelfi, Zamorah Ailes, MD 04/16/17 [phone]

## 2017-04-16 NOTE — ED Notes (Signed)
Patient transported to Ultrasound 

## 2017-04-16 NOTE — ED Notes (Signed)
Waiting for US at 0800

## 2017-04-16 NOTE — ED Triage Notes (Signed)
Per EMS [facility] was [facility] for lower abdominal pain after urinating and waited to long so they left and called 911; pt denies n/v/d, pain x5hrs

## 2017-04-16 NOTE — ED Provider Notes (Signed)
[facility] EMERGENCY DEPARTMENT Provider Note   CSN: [id number] Arrival date & time: 04/16/17  0035     History   Chief Complaint Chief Complaint  Patient presents with  . Abdominal Pain    HPI Ebony Hailvy S Whitehair is a 27 y.o. female.  The history is provided by the patient.  Abdominal Pain   This is a new problem. The current episode started 3 to 5 hours ago. The problem occurs constantly. The problem has not changed since onset.The pain is located in the RLQ and LLQ. The quality of the pain is sharp. The pain is severe. Associated symptoms include nausea. Pertinent negatives include fever, diarrhea, vomiting and dysuria. The symptoms are aggravated by certain positions (Standing). Relieved by: Rest.  Patient with history of asthma presents for sudden onset of abdominal pain at approximately 1920 on January 28.  She reports she is never had this pain before.  She reports it is worse with standing, but improves with sitting down. No vomiting, diarrhea.  She denies any vaginal bleeding or vaginal discharge. Denies any previous abdominal surgeries. Past Medical History:  Diagnosis Date  . Asthma   . Miscarriage     There are no active problems to display for this patient.   Past Surgical History:  Procedure Laterality Date  . TAB      OB History    Gravida Para Term Preterm AB Living   3 1 1  0 2 1   SAB TAB Ectopic Multiple Live Births   1 1 0 0 1       Home Medications    Prior to Admission medications   Medication Sig Start Date End Date Taking? Authorizing Provider  albuterol (PROVENTIL HFA;VENTOLIN HFA) 108 (90 BASE) MCG/ACT inhaler Inhale 2 puffs into the lungs every 6 (six) hours as needed for wheezing or shortness of breath.     [provider]  cetirizine (ZYRTEC) 10 MG tablet Take 10 mg by mouth daily.    [provider]  cyclobenzaprine (FLEXERIL) 5 MG tablet Take 1 tablet (5 mg total) by mouth at bedtime as needed for muscle  spasms. Patient not taking: Reported on 10/17/2016 04/18/14   Rodolph Bongorey, Evan S, MD  diclofenac (VOLTAREN) 50 MG EC tablet Take 1 tablet (50 mg total) by mouth 2 (two) times daily as needed. Patient not taking: Reported on 10/17/2016 04/18/14   Rodolph Bongorey, Evan S, MD  LORazepam (ATIVAN) 1 MG tablet Take 1 tablet (1 mg total) by mouth every 6 (six) hours as needed for anxiety. Patient not taking: Reported on 10/17/2016 02/08/14   Junious SilkMerrell, Hannah, PA-C    Family History Family History  Problem Relation Age of Onset  . Hyperlipidemia Father   . Hypertension Father   . Cancer Paternal Aunt   . Cancer Maternal Grandfather   . Anesthesia problems Neg Hx   . Hypotension Neg Hx   . Malignant hyperthermia Neg Hx   . Pseudochol deficiency Neg Hx     Social History Social History   Tobacco Use  . Smoking status: Never Smoker  . Smokeless tobacco: Never Used  Substance Use Topics  . Alcohol use: No  . Drug use: No     Allergies   Patient has no known allergies.   Review of Systems Review of Systems  Constitutional: Negative for fever.  Gastrointestinal: Positive for abdominal pain and nausea. Negative for diarrhea and vomiting.  Genitourinary: Negative for dysuria.  All other systems reviewed and are negative.  Physical Exam Updated Vital Signs BP 112/72 (BP Location: Left Arm)   Pulse 90   Resp 16   LMP 02/14/2017 Comment: (-) urine pregnancy test//a.c.  SpO2 100%   Physical Exam CONSTITUTIONAL: Well developed/well nourished HEAD: Normocephalic/atraumatic EYES: EOMI/PERRL, no icterus ENMT: Mucous membranes moist NECK: supple no meningeal signs SPINE/BACK:entire spine nontender CV: S1/S2 noted, no murmurs/rubs/gallops noted LUNGS: Lungs are clear to auscultation bilaterally, no apparent distress ABDOMEN: soft, moderate right lower quadrant tenderness noted, no rebound or guarding, bowel sounds noted throughout abdomen GU:no cva tenderness Pelvic exam performed with nurse Tresa Endo  present Minimal whitish discharge, no vaginal bleeding, no cervical motion tenderness, minimal right adnexal tenderness, no left adnexal tenderness, no adnexal mass noted NEURO: Pt is awake/alert/appropriate, moves all extremitiesx4.  No facial droop.   EXTREMITIES: pulses normal/equal, full ROM SKIN: warm, color normal PSYCH: no abnormalities of mood noted, alert and oriented to situation   ED Treatments / Results  Labs (all labs ordered are listed, but only abnormal results are displayed) Labs Reviewed  WET PREP, GENITAL - Abnormal; Notable for the following components:      Result Value   Clue Cells Wet Prep HPF POC PRESENT (*)    WBC, Wet Prep HPF POC MANY (*)    All other components within normal limits  COMPREHENSIVE METABOLIC PANEL - Abnormal; Notable for the following components:   Calcium 8.8 (*)    All other components within normal limits  CBC WITH DIFFERENTIAL/PLATELET - Abnormal; Notable for the following components:   WBC 10.9 (*)    All other components within normal limits  URINALYSIS, ROUTINE W REFLEX MICROSCOPIC - Abnormal; Notable for the following components:   APPearance CLOUDY (*)    Specific Gravity, Urine >1.030 (*)    Leukocytes, UA SMALL (*)    All other components within normal limits  URINALYSIS, MICROSCOPIC (REFLEX) - Abnormal; Notable for the following components:   Bacteria, UA MANY (*)    Squamous Epithelial / LPF TOO NUMEROUS TO COUNT (*)    All other components within normal limits  LIPASE, BLOOD  PREGNANCY, URINE  GC/CHLAMYDIA PROBE AMP ([place]) NOT AT [facility]    EKG  EKG Interpretation None       Radiology Ct Abdomen Pelvis W Contrast  Addendum Date: 04/16/2017   ADDENDUM REPORT: 04/16/2017 05:59 ADDENDUM: Note that it is also possible that the inflammatory changes around the descending colon represent extension of the hemorrhagic fluid from the pelvis up into the pericolic gutter with reactive inflammation rather than a primary  diverticulitis. Electronically Signed   By: Burman Nieves M.D.   On: 04/16/2017 05:59   Result Date: 04/16/2017 CLINICAL DATA:  Right lower quadrant pain for 10 hours. Bloating. Bacteria in urine. Negative urine pregnancy test. EXAM: CT ABDOMEN AND PELVIS WITH CONTRAST TECHNIQUE: Multidetector CT imaging of the abdomen and pelvis was performed using the standard protocol following bolus administration of intravenous contrast. CONTRAST:  ISOVUE-300 IOPAMIDOL (ISOVUE-300) INJECTION 61% COMPARISON:  None. FINDINGS: Lower chest: Lung bases are clear. Hepatobiliary: No focal liver abnormality is seen. No gallstones, gallbladder wall thickening, or biliary dilatation. Pancreas: Unremarkable. No pancreatic ductal dilatation or surrounding inflammatory changes. Spleen: Normal in size without focal abnormality. Adrenals/Urinary Tract: Adrenal glands are unremarkable. Kidneys are normal, without renal calculi, focal lesion, or hydronephrosis. Bladder is unremarkable. Stomach/Bowel: Stomach, small bowel, and colon are not abnormally distended. Diffusely stool-filled colon. Infiltration and edema around the descending/sigmoid colon junction, likely representing diverticulitis. No abscess. Vascular/Lymphatic: No significant  vascular findings are present. No enlarged abdominal or pelvic lymph nodes. Reproductive: Uterus is not enlarged. Cyst on the right ovary measuring 6.1 cm diameter. Mildly increased density suggesting hemorrhagic cyst. There is free fluid in the pelvis with increased density measurements suggesting hemorrhagic free fluid. This may result from rupture of a hemorrhagic cyst. Infected peritoneal fluid due to peritonitis could also have this appearance. Consider ultrasound of the pelvis for further evaluation. Other: No free air in the abdomen. Abdominal wall musculature appears intact. Musculoskeletal: No acute or significant osseous findings. IMPRESSION: 1. Infiltration and edema around the  descending colon likely representing acute diverticulitis. No abscess. 2. 6.1 cm diameter right ovarian cyst. Probably hemorrhagic free fluid in the pelvis may result from rupture of a hemorrhagic cyst. Infected peritoneal fluid due to peritonitis could also have this appearance. Suggest ultrasound pelvis for further evaluation. Electronically Signed: By: Burman Nieves M.D. On: 04/16/2017 05:45    Procedures Procedures (including critical care time)  Medications Ordered in ED Medications  ciprofloxacin (CIPRO) IVPB 400 mg (not administered)  iopamidol (ISOVUE-300) 61 % injection 100 mL (100 mLs Intravenous Contrast Given 04/16/17 0513)  metroNIDAZOLE (FLAGYL) IVPB 500 mg (0 mg Intravenous Stopped 04/16/17 0709)     Initial Impression / Assessment and Plan / ED Course  I have reviewed the triage vital signs and the nursing notes.  Pertinent labs results that were available during my care of the patient were reviewed by me and considered in my medical decision making (see chart for details).     4:45 AM Patient declines pain meds.  Plan to get CT imaging. 6:34 AM CT imaging results discussed with Dr. Andria Meuse from radiology. Patient may have diverticulitis, but she also has right ovarian cyst that may have ruptured.  Per radiology is difficult to determine if this is infected peritoneal free fluid versus fluid resulting from a hemorrhagic cyst  Plan to obtain emergent pelvic ultrasound 7:11 AM Signed out to dr Fredderick Phenix to f/u on US imaging D/c home with cipro/flagyl if US imaging reveals only ovarian cyst Final Clinical Impressions(s) / ED Diagnoses   Final diagnoses:  Right ovarian cyst  Diverticulitis    ED Discharge Orders    None       Zadie Rhine, MD 04/16/17 [phone]

## 2017-04-16 NOTE — ED Notes (Signed)
Patient transported to CT 

## 2017-04-17 LAB — GC/CHLAMYDIA PROBE AMP (~~LOC~~) NOT AT ARMC
CHLAMYDIA, DNA PROBE: NEGATIVE
Neisseria Gonorrhea: NEGATIVE

## 2017-04-19 NOTE — ED Notes (Signed)
04/19/2017, 12:15,   Called listed phone  Number, for follow-up call.  No answer, unable to leave a message

## 2017-06-05 ENCOUNTER — Emergency Department (HOSPITAL_COMMUNITY): Payer: 59

## 2017-06-05 ENCOUNTER — Other Ambulatory Visit: Payer: Self-pay

## 2017-06-05 DIAGNOSIS — R0789 Other chest pain: Secondary | ICD-10-CM | POA: Insufficient documentation

## 2017-06-05 DIAGNOSIS — R000 Tachycardia, unspecified: Secondary | ICD-10-CM | POA: Diagnosis not present

## 2017-06-05 DIAGNOSIS — R05 Cough: Secondary | ICD-10-CM | POA: Diagnosis not present

## 2017-06-05 DIAGNOSIS — R0602 Shortness of breath: Secondary | ICD-10-CM | POA: Diagnosis not present

## 2017-06-05 DIAGNOSIS — J4541 Moderate persistent asthma with (acute) exacerbation: Secondary | ICD-10-CM | POA: Insufficient documentation

## 2017-06-05 MED ORDER — ALBUTEROL SULFATE (2.5 MG/3ML) 0.083% IN NEBU
5.0000 mg | INHALATION_SOLUTION | Freq: Once | RESPIRATORY_TRACT | Status: AC
  Administered 2017-06-05: 5 mg via RESPIRATORY_TRACT
  Filled 2017-06-05: qty 6

## 2017-06-05 NOTE — ED Triage Notes (Signed)
Per Pt: Pt started having SOB and chest pain yesterday. Pt tried using her inhaler with no relief. Pt denies trying any breathing treatments.  Pt has a hx of asthma and reports being allergic to cats and marijuana. Pt reports she has a cat in the house (x2 years), but has never had any issued.

## 2017-06-06 ENCOUNTER — Emergency Department (HOSPITAL_COMMUNITY)
Admission: EM | Admit: 2017-06-06 | Discharge: 2017-06-06 | Disposition: A | Discharge status: Home / Self Care | Payer: 59 | Attending: Emergency Medicine | Admitting: Emergency Medicine

## 2017-06-06 DIAGNOSIS — R05 Cough: Secondary | ICD-10-CM | POA: Diagnosis not present

## 2017-06-06 DIAGNOSIS — R0789 Other chest pain: Secondary | ICD-10-CM | POA: Diagnosis not present

## 2017-06-06 DIAGNOSIS — J4541 Moderate persistent asthma with (acute) exacerbation: Secondary | ICD-10-CM

## 2017-06-06 MED ORDER — IPRATROPIUM-ALBUTEROL 0.5-2.5 (3) MG/3ML IN SOLN
3.0000 mL | Freq: Once | RESPIRATORY_TRACT | Status: AC
  Administered 2017-06-06: 3 mL via RESPIRATORY_TRACT
  Filled 2017-06-06: qty 3

## 2017-06-06 MED ORDER — PREDNISONE 20 MG PO TABS
60.0000 mg | ORAL_TABLET | Freq: Once | ORAL | Status: AC
  Administered 2017-06-06: 60 mg via ORAL
  Filled 2017-06-06: qty 3

## 2017-06-06 MED ORDER — PREDNISONE 50 MG PO TABS: 50.0000 mg | ORAL_TABLET | Freq: Every day | ORAL | 0 refills | Status: DC

## 2017-06-06 NOTE — Discharge Instructions (Addendum)
Continue to use your inhaler every four hours as needed. Return if symptoms are getting worse.

## 2017-06-06 NOTE — ED Provider Notes (Signed)
[facility]-EMERGENCY DEPT Provider Note   CSN: [id number] Arrival date & time: 06/05/17  2044     History   Chief Complaint Chief Complaint  Patient presents with  . Shortness of Breath  . Chest Pain    HPI Erin [name] is a 27 y.o. female.   The history is provided by the patient.  She has history of asthma and comes in with shortness of breath for the last 2 days.  She describes a tight feeling in her chest, and a nonproductive cough.  She thought it was an asthma exacerbation, but she did not notice any response when she used her albuterol inhaler.  She denies fever or chills.  She denies sore throat, but noticed difficulty swallowing this evening.  She noticed that she feels worse when she is in a closed space, better when she is outside.  She had recently moved and thought that it might be some dust that was kicked up removing, but symptoms do not go away when she gets out of her home.  She is a non-smoker.  Of note, she did receive an albuterol nebulizer treatment prior to my seeing her.  She has noted significant improvement with that treatment and states she is now able to swallow.  Past Medical History:  Diagnosis Date  . Asthma   . Miscarriage     There are no active problems to display for this patient.   Past Surgical History:  Procedure Laterality Date  . TAB      OB History    Gravida Para Term Preterm AB Living   3 1 1  0 2 1   SAB TAB Ectopic Multiple Live Births   1 1 0 0 1       Home Medications    Prior to Admission medications   Medication Sig Start Date End Date Taking? Authorizing Provider  albuterol (PROVENTIL HFA;VENTOLIN HFA) 108 (90 BASE) MCG/ACT inhaler Inhale 2 puffs into the lungs every 6 (six) hours as needed for wheezing or shortness of breath.     [provider]  cetirizine (ZYRTEC) 10 MG tablet Take 10 mg by mouth daily.    [provider]  cyclobenzaprine (FLEXERIL) 5 MG tablet Take 1 tablet (5  mg total) by mouth at bedtime as needed for muscle spasms. Patient not taking: Reported on 10/17/2016 04/18/14   Rodolph Bong, MD  diclofenac (VOLTAREN) 50 MG EC tablet Take 1 tablet (50 mg total) by mouth 2 (two) times daily as needed. Patient not taking: Reported on 10/17/2016 04/18/14   Rodolph Bong, MD  LORazepam (ATIVAN) 1 MG tablet Take 1 tablet (1 mg total) by mouth every 6 (six) hours as needed for anxiety. Patient not taking: Reported on 10/17/2016 02/08/14   Junious Silk, PA-C  metroNIDAZOLE (FLAGYL) 500 MG tablet Take 1 tablet (500 mg total) by mouth 2 (two) times daily. One po bid x 7 days 04/16/17   Rolan Bucco, MD  traMADol (ULTRAM) 50 MG tablet Take 1 tablet (50 mg total) by mouth every 6 (six) hours as needed. 04/16/17   Rolan Bucco, MD    Family History Family History  Problem Relation Age of Onset  . Hyperlipidemia Father   . Hypertension Father   . Cancer Paternal Aunt   . Cancer Maternal Grandfather   . Anesthesia problems Neg Hx   . Hypotension Neg Hx   . Malignant hyperthermia Neg Hx   . Pseudochol deficiency Neg Hx  Social History Social History   Tobacco Use  . Smoking status: Never Smoker  . Smokeless tobacco: Never Used  Substance Use Topics  . Alcohol use: No  . Drug use: No     Allergies   Marijuana (cannabis sativa)   Review of Systems Review of Systems  All other systems reviewed and are negative.    Physical Exam Updated Vital Signs BP 134/85 (BP Location: Left Arm)   Pulse (!) 115   Temp 98.6 F (37 C) (Oral)   Resp 18   LMP 03/21/2017   SpO2 95%   Physical Exam  Nursing note and vitals reviewed.  27 year old female, resting comfortably and in no acute distress. Vital signs are significant for elevated heart rate. Oxygen saturation is 95%, which is normal. Head is normocephalic and atraumatic. PERRLA, EOMI. Oropharynx is clear.  There is no pooling of secretions.  Phonation is normal. Neck is nontender and supple without  adenopathy or JVD. Back is nontender and there is no CVA tenderness. Lungs have prolonged exhalation phase with faint expiratory wheezes.  There are no rales or rhonchi. Chest is nontender. Heart has regular rate and rhythm without murmur. Abdomen is soft, flat, nontender without masses or hepatosplenomegaly and peristalsis is normoactive. Extremities have no cyanosis or edema, full range of motion is present. Skin is warm and dry without rash. Neurologic: Mental status is normal, cranial nerves are intact, there are no motor or sensory deficits.  ED Treatments / Results   EKG  EKG Interpretation  Date/Time:  Wednesday June 05 2017 20:54:38 EDT Ventricular Rate:  114 PR Interval:    QRS Duration: 101 QT Interval:  324 QTC Calculation: 447 R Axis:   84 Text Interpretation:  Sinus tachycardia Consider left atrial enlargement Baseline wander in lead(s) V5 Otherwise within normal limits When compared with ECG of 02/08/2014, No significant change was found Confirmed by Dione BoozeGlick, Addysin Porco ([id number]) on 06/06/2017 12:47:58 AM       Radiology Dg Chest 2 View  Result Date: 06/05/2017 CLINICAL DATA:  Shortness of breath EXAM: CHEST - 2 VIEW COMPARISON:  Chest radiograph 02/08/2014 FINDINGS: The heart size and mediastinal contours are within normal limits. Both lungs are clear. The visualized skeletal structures are unremarkable. IMPRESSION: No active cardiopulmonary disease. Electronically Signed   By: Deatra RobinsonKevin  Herman M.D.   On: 06/05/2017 21:29    Procedures Procedures   Medications Ordered in ED Medications  albuterol (PROVENTIL) (2.5 MG/3ML) 0.083% nebulizer solution 5 mg (5 mg Nebulization Given 06/05/17 2059)  ipratropium-albuterol (DUONEB) 0.5-2.5 (3) MG/3ML nebulizer solution 3 mL (3 mLs Nebulization Given 06/06/17 0302)  predniSONE (DELTASONE) tablet 60 mg (60 mg Oral Given 06/06/17 0303)     Initial Impression / Assessment and Plan / ED Course  I have reviewed the triage vital signs and  the nursing notes.  Pertinent imaging results that were available during my care of the patient were reviewed by me and considered in my medical decision making (see chart for details).  Dyspnea which probably is an exacerbation of asthma.  Chest x-ray shows no evidence of pneumonia.  She had partial response to 1 nebulizer treatment.  She will be given dose of prednisone and a second nebulizer treatment with albuterol and ipratropium.  Old records are reviewed, and she does have prior ED visits for asthma and bronchitis.  ECG shows sinus tachycardia, but no acute changes.  Following second nebulizer treatment, she states that she feels that she is back to baseline.  On  reexam, lungs are completely clear.  She is discharged with prescription for prednisone and is advised to continue using her home inhaler as needed.  Return precautions discussed.  Final Clinical Impressions(s) / ED Diagnoses   Final diagnoses:  Moderate persistent asthma with exacerbation    ED Discharge Orders        Ordered    predniSONE (DELTASONE) 50 MG tablet  Daily     06/06/17 0517      Dione Booze, MD 06/06/17 [phone]

## 2017-09-11 ENCOUNTER — Ambulatory Visit: Payer: Self-pay | Admitting: Family Medicine

## 2017-09-11 VITALS — BP 110/70 | HR 94 | Temp 98.6°F | Resp 18 | Ht 63.0 in | Wt 226.2 lb

## 2017-09-11 DIAGNOSIS — Z0000 Encounter for general adult medical examination without abnormal findings: Secondary | ICD-10-CM

## 2017-09-11 NOTE — Patient Instructions (Signed)

## 2017-09-11 NOTE — Progress Notes (Signed)
Erin [name] is a 27 y.o. female who presents today with concerns of need for an annual physical exam. She has chronic conditions of asthma and scapular pain historically. She has not had annual routine care of a PCP or gyn in nature since 2014. She now has insurance coverage and can obtain the care she needs.  Review of Systems  Constitutional: Negative for chills, fever and malaise/fatigue.  HENT: Negative for congestion, ear discharge, ear pain, sinus pain and sore throat.   Eyes: Negative.   Respiratory: Negative for cough, sputum production and shortness of breath.   Cardiovascular: Negative.  Negative for chest pain.  Gastrointestinal: Negative for abdominal pain, diarrhea, nausea and vomiting.  Genitourinary: Negative for dysuria, frequency, hematuria and urgency.  Musculoskeletal: Negative for myalgias.  Skin: Negative.   Neurological: Negative for headaches.  Endo/Heme/Allergies: Negative.   Psychiatric/Behavioral: Negative.     O: Vitals:   09/11/17 1935  BP: 110/70  Pulse: 94  Resp: 18  Temp: 98.6 F (37 C)  SpO2: 98%     Physical Exam  Constitutional: She is oriented to person, place, and time. Vital signs are normal. She appears well-developed and well-nourished. She is active.  Non-toxic appearance. She does not have a sickly appearance.  HENT:  Head: Normocephalic.  Right Ear: Hearing, tympanic membrane, external ear and ear canal normal.  Left Ear: Hearing, tympanic membrane, external ear and ear canal normal.  Nose: Nose normal.  Mouth/Throat: Uvula is midline and oropharynx is clear and moist.  Neck: Normal range of motion. Neck supple.  Cardiovascular: Normal rate, regular rhythm, normal heart sounds and normal pulses.  Pulmonary/Chest: Effort normal and breath sounds normal.  Abdominal: Soft. Bowel sounds are normal.  Musculoskeletal: Normal range of motion.  Lymphadenopathy:       Head (right side): No submental and no submandibular adenopathy present.       Head (left side): No submental and no submandibular adenopathy present.    She has no cervical adenopathy.  Neurological: She is alert and oriented to person, place, and time.  Psychiatric: She has a normal mood and affect.  Vitals reviewed.  A: 1. Physical exam    P: Exam findings, diagnosis etiology and medication use and indications reviewed with patient. Follow- Up and discharge instructions provided. No emergent/urgent issues found on exam.  Patient verbalized understanding of information provided and agrees with plan of care (POC), all questions answered.  1. Physical exam WNL- patient now has coverage and will obtain a PCP.

## 2017-10-01 DIAGNOSIS — J4520 Mild intermittent asthma, uncomplicated: Secondary | ICD-10-CM | POA: Diagnosis not present

## 2017-10-01 DIAGNOSIS — E6609 Other obesity due to excess calories: Secondary | ICD-10-CM | POA: Diagnosis not present

## 2017-10-01 DIAGNOSIS — R0602 Shortness of breath: Secondary | ICD-10-CM | POA: Diagnosis not present

## 2017-10-01 DIAGNOSIS — R072 Precordial pain: Secondary | ICD-10-CM | POA: Diagnosis not present

## 2017-10-03 MED FILL — VENTOLIN HFA 90 MCG INHALER: 108 (90 BAS | 25 days supply | Qty: 18 | Fill #0

## 2017-10-03 MED FILL — MONTELUKAST SOD 10 MG TAB: 10 | 30 days supply | Qty: 30 | Fill #0

## 2017-11-11 MED FILL — MONTELUKAST SOD 10 MG TAB: 10 | 30 days supply | Qty: 30 | Fill #1

## 2017-12-12 MED FILL — MONTELUKAST SOD 10 MG TAB: 10 | 30 days supply | Qty: 30 | Fill #2

## 2017-12-25 MED FILL — MONTELUKAST SOD 10 MG TAB: 10 | 30 days supply | Qty: 30 | Fill #2

## 2018-02-03 MED FILL — MONTELUKAST SOD 10 MG TAB: 10 | 30 days supply | Qty: 30 | Fill #3

## 2018-02-10 ENCOUNTER — Encounter: Payer: Self-pay | Admitting: Obstetrics and Gynecology

## 2018-02-10 ENCOUNTER — Ambulatory Visit: Payer: Self-pay | Admitting: Obstetrics and Gynecology

## 2018-02-10 VITALS — BP 118/72 | HR 90 | Temp 98.7°F | Wt 227.2 lb

## 2018-02-10 DIAGNOSIS — J040 Acute laryngitis: Secondary | ICD-10-CM

## 2018-02-10 MED ORDER — PREDNISONE 20 MG PO TABS: 60.0000 mg | ORAL_TABLET | Freq: Every day | ORAL | 0 refills | Status: DC

## 2018-02-10 MED ORDER — RANITIDINE HCL 150 MG PO TABS
150.0000 mg | ORAL_TABLET | Freq: Two times a day (BID) | ORAL | 0 refills | Status: DC
Start: 2018-02-10 — End: 2018-06-12

## 2018-02-10 MED FILL — predniSONE 20 MG TABS: 20 | 5 days supply | Qty: 15 | Fill #0

## 2018-02-10 NOTE — Progress Notes (Signed)
  Subjective:     Patient ID: Erin [name], female   DOB: 1990/12/12, 27 y.o.   MRN: [id number]  HPI  Ms.Erin [name] is a 27 y.o. female here with laryngitis. She says 1 month ago she had an URI and lost her voice which is not uncommon for her. Usually her voice returns in 1-2 weeks, however this time she has not had a voice for 1 month. All of the other URI symptoms resoloved and she overall feels well. 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 No sore throat other than when she is talking for long periods of time. This is the first time she has been see for this problem in the last month.  Non smoker. History of asthma however controlled. She has a rescue inhaler however has not had to use it.   Review of Systems  HENT: Positive for sore throat and voice change. Negative for congestion, postnasal drip and trouble swallowing.    Objective:   Physical Exam  Constitutional: She appears well-developed and well-nourished. No distress.  HENT:  Mouth/Throat: Uvula is midline and mucous membranes are normal. No oral lesions. No uvula swelling. Posterior oropharyngeal erythema present. No oropharyngeal exudate, posterior oropharyngeal edema or tonsillar abscesses. Tonsils are 3+ on the right. Tonsils are 3+ on the left.  Skin: She is not diaphoretic.   Assessment:   1. Laryngitis    Plan:   Well appearing female with laryngitis x 1 month. Rx: Prednisone, Zantac  Cool mist humidifier in bedroom Call PCP for appointment this week, patient needs referral to otolaryngologist. Discussed the importance of this.  Letter given today for voice rest for her job. Discussed the importance of this.    Venia Carbonasch, Katye Valek I, NP 02/10/2018 2:18 PM

## 2018-02-11 MED FILL — FAMOTIDINE 20 MG TABLET: 20 | 30 days supply | Qty: 60 | Fill #0

## 2018-06-12 ENCOUNTER — Other Ambulatory Visit: Payer: Self-pay

## 2018-06-12 ENCOUNTER — Telehealth (INDEPENDENT_AMBULATORY_CARE_PROVIDER_SITE_OTHER): Payer: Medicaid Other | Admitting: Family Medicine

## 2018-06-12 DIAGNOSIS — J4540 Moderate persistent asthma, uncomplicated: Secondary | ICD-10-CM

## 2018-06-12 DIAGNOSIS — F329 Major depressive disorder, single episode, unspecified: Secondary | ICD-10-CM | POA: Insufficient documentation

## 2018-06-12 DIAGNOSIS — F321 Major depressive disorder, single episode, moderate: Secondary | ICD-10-CM

## 2018-06-12 MED ORDER — FLUTICASONE-SALMETEROL 100-50 MCG/DOSE IN AEPB: 1.0000 | INHALATION_SPRAY | Freq: Two times a day (BID) | RESPIRATORY_TRACT | 2 refills | Status: DC

## 2018-06-12 MED ORDER — MONTELUKAST SODIUM 10 MG PO TABS: 10.0000 mg | ORAL_TABLET | Freq: Every day | ORAL | 3 refills | Status: DC

## 2018-06-12 MED ORDER — ALBUTEROL SULFATE 108 (90 BASE) MCG/ACT IN AEPB: 2.0000 | INHALATION_SPRAY | Freq: Four times a day (QID) | RESPIRATORY_TRACT | 5 refills | Status: DC | PRN

## 2018-06-12 MED FILL — ADVAIR 100/50 DISKUS: 100-50 | 30 days supply | Qty: 60 | Fill #0

## 2018-06-12 MED FILL — MONTELUKAST SOD 10 MG TAB: 10 | 30 days supply | Qty: 30 | Fill #0

## 2018-06-12 NOTE — Progress Notes (Signed)
Virtual Visit via telephone Note  I connected with patient on 06/12/18 at 1143 by telephone and verified that I am speaking with the correct person using two identifiers. Erin [name] is currently located at home and patient is currently with her during visit. The provider, Myles Lipps, MD is located in their office at time of visit.  I discussed the limitations, risks, security and privacy concerns of performing an evaluation and management service by telephone and the availability of in person appointments. I also discussed with the patient that there may be a patient responsible charge related to this service. The patient expressed understanding and agreed to proceed.  CC: establish care  Telephone visit today to establish care, h/o asthma and depression  HPI  She was seeing Dr Algie Coffer, who prescribed singulair and albuetrol, last OV in April 2019 At one time she was on advair - did really well, had to stop due to cost She does not think she has ever had oral prednisone Has never been hosp for asthma Reports asthma triggers: seasonal and cat allergies, emotional stress, if she tries to exercise Asthma is not well controlled: having wheezing, SOB, cough Has been using nebulizer Has been trying to exercise to lose weight Allergies doing well on zyrtec and singulair Has a cat in the house  Depression started with last pregnancy, in 2011 Has stabilized but not resolved Has good days and bad days Denies SI Does not want to start medication She is interested in counseling  Fall Risk  06/12/2018  Falls in the past year? 0  Injury with Fall? 0  Follow up Falls evaluation completed     Depression screen [facility] 2/9 06/12/2018  Decreased Interest 0  Down, Depressed, Hopeless 0  PHQ - 2 Score 0    Allergies  Allergen Reactions  . Marijuana (Cannabis Sativa) Anaphylaxis, Hives and Swelling    Dx in [place] at the age of 2  . Naproxen Sodium     Difficulty with imbalance;      Prior to Admission medications   Medication Sig Start Date End Date Taking? Authorizing Provider  albuterol (PROVENTIL HFA;VENTOLIN HFA) 108 (90 BASE) MCG/ACT inhaler Inhale 2 puffs into the lungs every 6 (six) hours as needed for wheezing or shortness of breath.    Yes [provider]  cetirizine (ZYRTEC) 10 MG tablet Take 10 mg by mouth daily.   Yes [provider]    Past Medical History:  Diagnosis Date  . Asthma   . Miscarriage     Past Surgical History:  Procedure Laterality Date  . TAB      Social History   Tobacco Use  . Smoking status: Never Smoker  . Smokeless tobacco: Never Used  Substance Use Topics  . Alcohol use: No    Family History  Problem Relation Age of Onset  . Hyperlipidemia Father   . Hypertension Father   . Cancer Paternal Aunt   . Cancer Maternal Grandfather   . Anesthesia problems Neg Hx   . Hypotension Neg Hx   . Malignant hyperthermia Neg Hx   . Pseudochol deficiency Neg Hx     ROS Per hpi  Objective  Vitals as reported by the patient: none  There were no vitals filed for this visit.  ASSESSMENT and PLAN  1. Moderate persistent asthma without complication Uncontrolled. Starting her back on advair. RTC precautions reviewed.   2. Major depressive disorder, single episode, moderate (HCC) Declines medication at this time. Denies  SI. Referring to counseling as requested - Ambulatory referral to Psychology  Other orders - Fluticasone-Salmeterol (ADVAIR DISKUS) 100-50 MCG/DOSE AEPB; Inhale 1 puff into the lungs 2 (two) times daily. - Albuterol Sulfate (PROAIR RESPICLICK) 108 (90 Base) MCG/ACT AEPB; Inhale 2 puffs into the lungs every 6 (six) hours as needed. - montelukast (SINGULAIR) 10 MG tablet; Take 1 tablet (10 mg total) by mouth at bedtime.  FOLLOW-UP: 2 weeks for asthma   The above assessment and management plan was discussed with the patient. The patient verbalized understanding of and has agreed to the  management plan. Patient is aware to call the clinic if symptoms persist or worsen. Patient is aware when to return to the clinic for a follow-up visit. Patient educated on when it is appropriate to go to the emergency department.    I provided 23 minutes of non-face-to-face time during this encounter.  Myles Lipps, MD Primary Care at [facility] [address] [place], [place] [address] Ph.  [phone] Fax [phone]

## 2018-06-12 NOTE — Progress Notes (Signed)
Hx of depression, Hx asthma   Today talk about asthma, inhaler refill

## 2018-06-12 NOTE — Patient Instructions (Signed)
° ° ° °  If you have lab work done today you will be contacted with your lab results within the next 2 weeks.  If you have not heard from us then please contact us. The fastest way to get your results is to register for My Chart. ° ° °IF you received an x-ray today, you will receive an invoice from [facility]. Please contact  Radiology at [phone] with questions or concerns regarding your invoice.  ° °IF you received labwork today, you will receive an invoice from [facility]. Please contact [facility] at [phone] with questions or concerns regarding your invoice.  ° °Our billing staff will not be able to assist you with questions regarding bills from these companies. ° °You will be contacted with the lab results as soon as they are available. The fastest way to get your results is to activate your My Chart account. Instructions are located on the last page of this paperwork. If you have not heard from us regarding the results in 2 weeks, please contact this office. °  ° ° ° °

## 2018-06-13 MED FILL — VENTOLIN HFA 90 MCG INHALER: 108 (90 BAS | 25 days supply | Qty: 18 | Fill #0

## 2018-07-07 ENCOUNTER — Ambulatory Visit: Payer: Self-pay | Admitting: Psychology

## 2018-07-16 MED FILL — ADVAIR 100/50 DISKUS: 100-50 | 30 days supply | Qty: 60 | Fill #1

## 2018-07-16 MED FILL — MONTELUKAST SOD 10 MG TAB: 10 | 30 days supply | Qty: 30 | Fill #1

## 2018-08-21 MED FILL — ADVAIR 100/50 DISKUS: 100-50 | 30 days supply | Qty: 60 | Fill #2

## 2018-08-21 MED FILL — MONTELUKAST SOD 10 MG TAB: 10 | 30 days supply | Qty: 30 | Fill #2

## 2018-10-10 ENCOUNTER — Other Ambulatory Visit: Payer: Self-pay | Admitting: Family Medicine

## 2018-10-10 MED FILL — MONTELUKAST SOD 10 MG TAB: 10 | 30 days supply | Qty: 30 | Fill #3

## 2018-10-10 MED FILL — ADVAIR 100/50 DISKUS: 100-50 | 30 days supply | Qty: 60 | Fill #0

## 2018-10-10 NOTE — Telephone Encounter (Signed)
Requested Prescriptions  Pending Prescriptions Disp Refills  . ADVAIR DISKUS 100-50 MCG/DOSE AEPB [Pharmacy Med Name: ADVAIR 100/50 DISKUS 100-50 AERO] 60 each 2    Sig: INHALE 1 PUFF BY MOUTH INTO THE LUNGS 2 TIMES DAILY.     Pulmonology:  Combination Products Failed - 10/10/2018  9:12 AM      Failed - Valid encounter within last 12 months    Recent Outpatient Visits    None

## 2018-11-03 ENCOUNTER — Ambulatory Visit: Payer: No Typology Code available for payment source | Admitting: Allergy

## 2018-11-13 ENCOUNTER — Other Ambulatory Visit: Payer: Self-pay | Admitting: Family Medicine

## 2018-11-13 MED FILL — ADVAIR 100/50 DISKUS: 100-50 | 30 days supply | Qty: 60 | Fill #1

## 2018-11-13 NOTE — Telephone Encounter (Signed)
Requested medication (s) are due for refill today: yes  Requested medication (s) are on the active medication list:yes  Last refill:  06/12/2018  Future visit scheduled: no  Notes to clinic:  Ordering provider and pcp are different   Requested Prescriptions  Pending Prescriptions Disp Refills   montelukast (SINGULAIR) 10 MG tablet [Pharmacy Med Name: MONTELUKAST SOD 10 MG TAB 10 TAB] 30 tablet 3    Sig: TAKE 1 TABLET BY MOUTH AT BEDTIME.     Pulmonology:  Leukotriene Inhibitors Failed - 11/13/2018  8:01 AM      Failed - Valid encounter within last 12 months    Recent Outpatient Visits    None

## 2018-11-14 MED FILL — MONTELUKAST SOD 10 MG TAB: 10 | 30 days supply | Qty: 30 | Fill #0

## 2018-11-17 ENCOUNTER — Ambulatory Visit: Payer: No Typology Code available for payment source | Admitting: Allergy

## 2019-01-16 MED FILL — ADVAIR 100/50 DISKUS: 100-50 | 30 days supply | Qty: 60 | Fill #2

## 2019-02-25 MED FILL — AMOX-CLAV 875-125 MG TABLET: 875-125 | 10 days supply | Qty: 20 | Fill #0

## 2019-02-26 ENCOUNTER — Ambulatory Visit: Payer: No Typology Code available for payment source | Admitting: Family Medicine

## 2019-03-25 ENCOUNTER — Other Ambulatory Visit: Payer: Self-pay | Admitting: Family Medicine

## 2019-03-25 ENCOUNTER — Other Ambulatory Visit: Payer: Self-pay | Admitting: *Deleted

## 2019-03-25 MED ORDER — PROAIR RESPICLICK 108 (90 BASE) MCG/ACT IN AEPB: 2.0000 | INHALATION_SPRAY | Freq: Four times a day (QID) | RESPIRATORY_TRACT | 0 refills | Status: DC | PRN

## 2019-03-25 MED FILL — ADVAIR 100/50 DISKUS: 100-50 | 30 days supply | Qty: 60 | Fill #0

## 2019-03-25 NOTE — Telephone Encounter (Signed)
   Notes to clinic:  Alternative Requested:MEDICATION IS NOT COVERED BY INSURANCE   Requested Prescriptions  Pending Prescriptions Disp Refills   albuterol (VENTOLIN HFA) 108 (90 Base) MCG/ACT inhaler [Pharmacy Med Name: ALBUTEROL HFA (PROAIR) INHALER]  0    Sig: Please specify directions, refills and quantity      Pulmonology:  Beta Agonists Failed - 03/25/2019  2:29 PM      Failed - One inhaler should last at least one month. If the patient is requesting refills earlier, contact the patient to check for uncontrolled symptoms.      Passed - Valid encounter within last 12 months    Recent Outpatient Visits           9 months ago Moderate persistent asthma without complication   [facility], Meda Coffee, MD

## 2019-03-25 NOTE — Telephone Encounter (Signed)
Requested medication (s) are due for refill today: yes  Requested medication (s) are on the active medication list: yes  Last refill:  01/16/2019  Future visit scheduled: no  Notes to clinic:  Last filled by Dr Leretha Pol but the pcp is different Please advise   Requested Prescriptions  Pending Prescriptions Disp Refills   ADVAIR DISKUS 100-50 MCG/DOSE AEPB [Pharmacy Med Name: ADVAIR 100/50 DISKUS 100-50 Aerosol] 60 each 2    Sig: INHALE 1 PUFF BY MOUTH INTO THE LUNGS 2 TIMES DAILY.      Pulmonology:  Combination Products Passed - 03/25/2019 12:05 PM      Passed - Valid encounter within last 12 months    Recent Outpatient Visits           9 months ago Moderate persistent asthma without complication   [facility], Meda Coffee, MD

## 2019-03-26 MED FILL — CHLORHEXIDINE 0.12% RINSE: 0.12 | 15 days supply | Qty: 473 | Fill #0

## 2019-03-26 MED FILL — HYDROCODON-APAP 5-325: 5-325 | 1 days supply | Qty: 5 | Fill #0

## 2019-03-26 MED FILL — DEXAMETHASONE 4 MG TABLET: 4 | 3 days supply | Qty: 6 | Fill #0

## 2019-03-26 MED FILL — IBUPROFEN 400 MG TABS: 400 | 7 days supply | Qty: 30 | Fill #0

## 2019-03-27 ENCOUNTER — Other Ambulatory Visit: Payer: Self-pay | Admitting: Family Medicine

## 2019-04-03 ENCOUNTER — Other Ambulatory Visit: Payer: Self-pay | Admitting: Family Medicine

## 2019-04-03 MED FILL — ALBUTEROL SULFATE HFA 108 (: 108 (90 BAS | 25 days supply | Qty: 18 | Fill #0

## 2019-04-10 MED FILL — AMOXICILLIN 500 MG CAPSULE: 500 | 7 days supply | Qty: 21 | Fill #0

## 2019-07-20 IMAGING — CT CT ABD-PELV W/ CM
2 of 4 series · 15 of 46 positions shown, 17 images · IV contrast (APPLIED)
Comparison: None.

ADDENDUM:
Note that it is also possible that the inflammatory changes around
the descending colon represent extension of the hemorrhagic fluid
from the pelvis up into the pericolic gutter with reactive
inflammation rather than a primary diverticulitis.
CLINICAL DATA: Right lower quadrant pain for 10 hours. Bloating.
Bacteria in urine. Negative urine pregnancy test.

EXAM:
CT ABDOMEN AND PELVIS WITH CONTRAST
TECHNIQUE: Multidetector CT imaging of the abdomen and pelvis was performed
using the standard protocol following bolus administration of
intravenous contrast.
CONTRAST:  100mL [id number] IOPAMIDOL (UA1LWY-TXX) INJECTION 61%

[Series 3: axial st · axial · 0.84mm/px · z∈[-470,-45]mm · 12 of 93 slices shown, 14 images]
[im 4/93  soft-tissue]
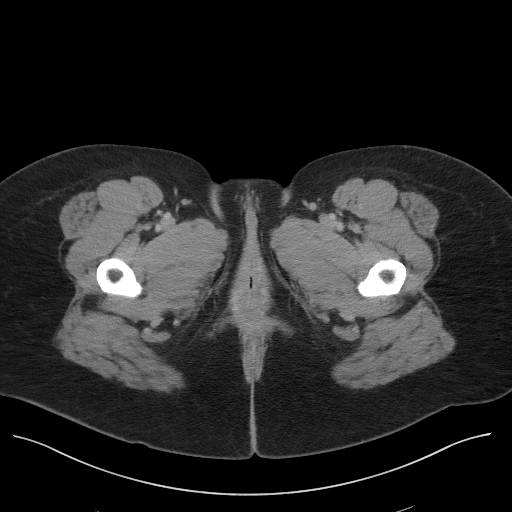
[im 4/93  bone]
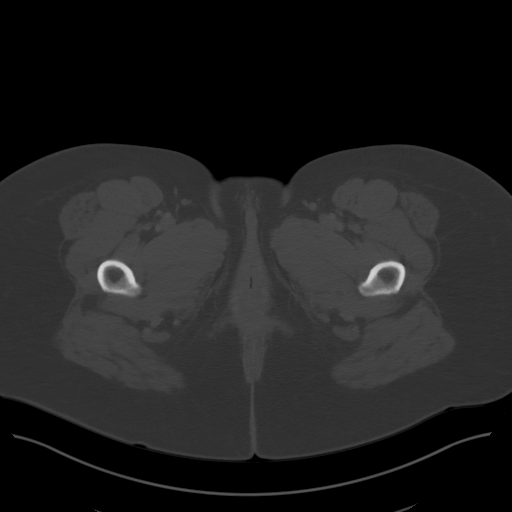
[im 12/93  soft-tissue]
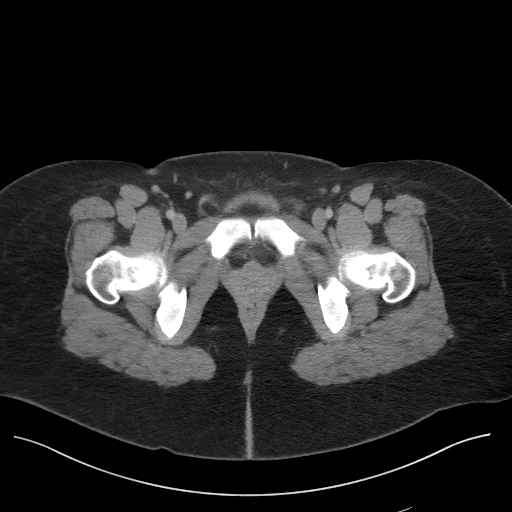
[im 20/93  soft-tissue]
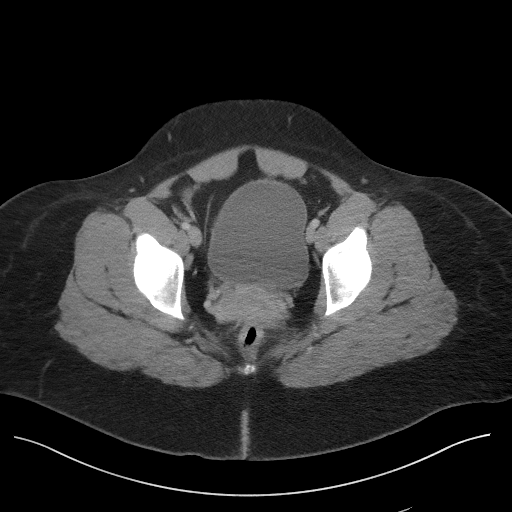
[im 27/93  soft-tissue]
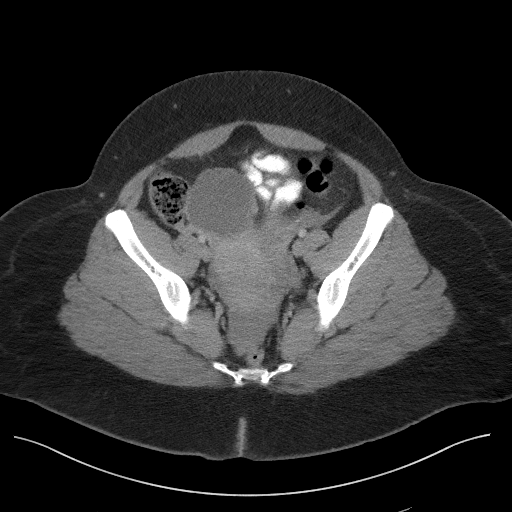
[im 35/93  soft-tissue]
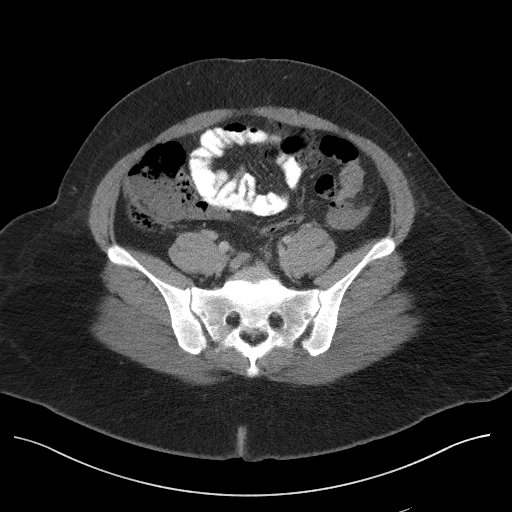
[im 43/93  soft-tissue]
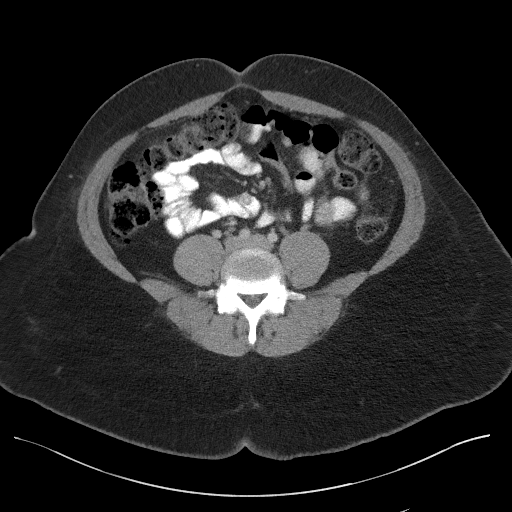
[im 50/93  soft-tissue]
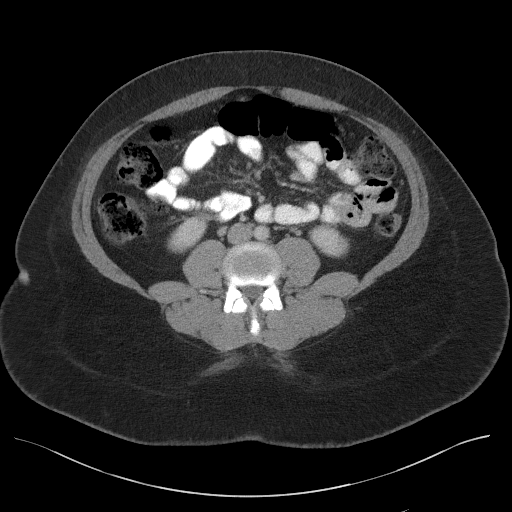
[im 58/93  soft-tissue]
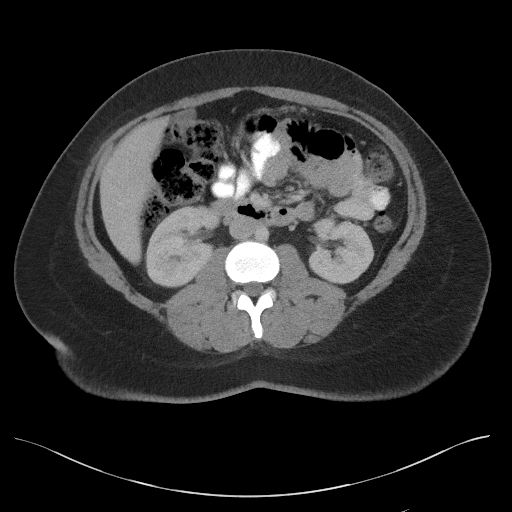
[im 66/93  soft-tissue]
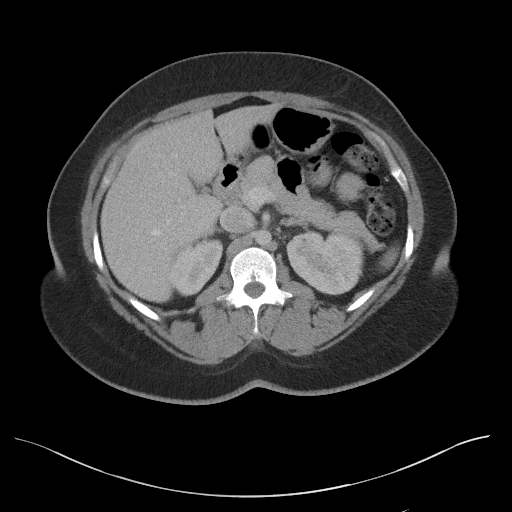
[im 66/93  bone]
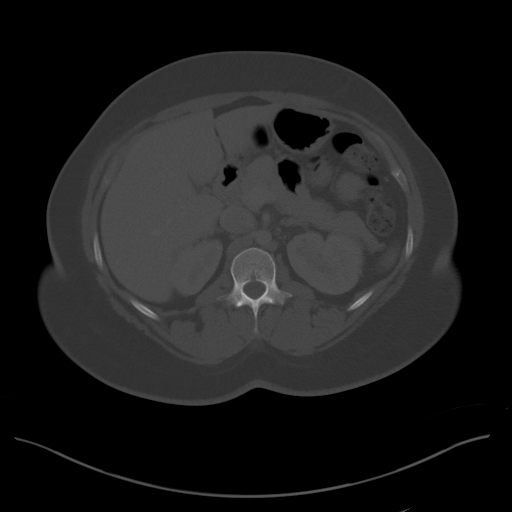
[im 73/93  soft-tissue]
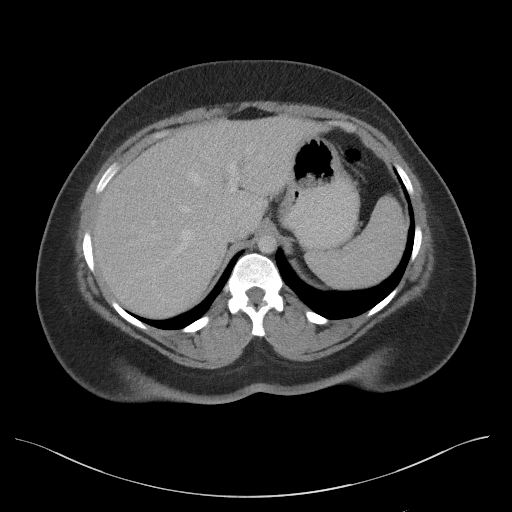
[im 81/93  soft-tissue]
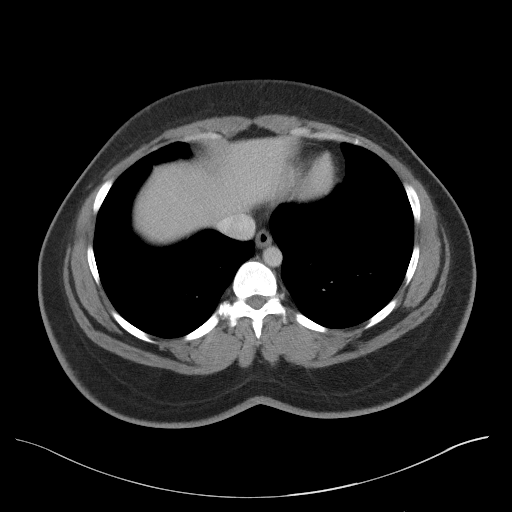
[im 89/93  soft-tissue]
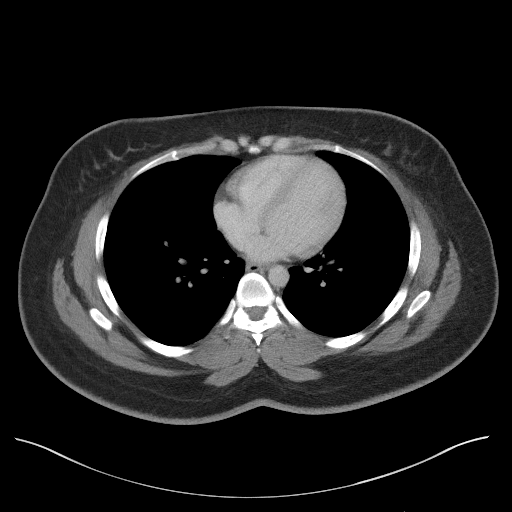

[Series 6: coronal st · coronal · 0.76mm/px · 3 of 86 slices shown]
[im 29/86  soft-tissue]
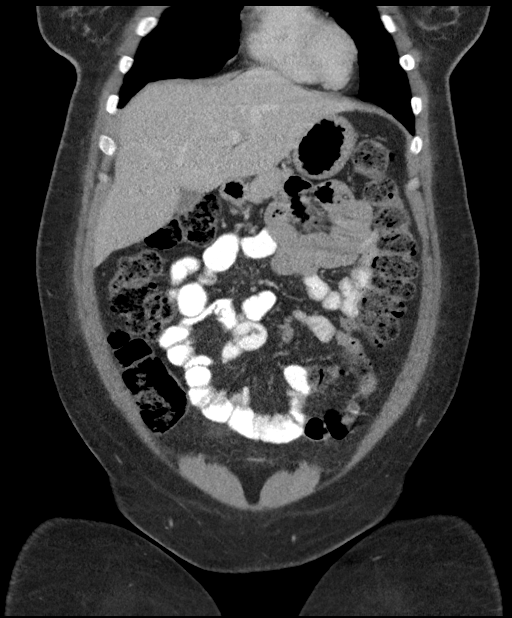
[im 38/86  soft-tissue]
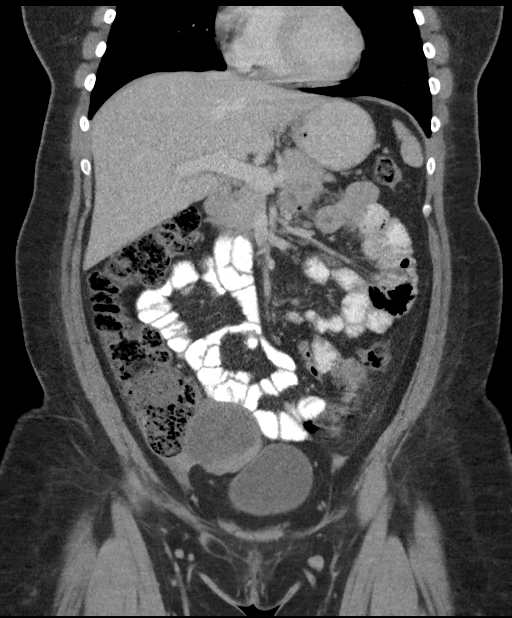
[im 48/86  soft-tissue]
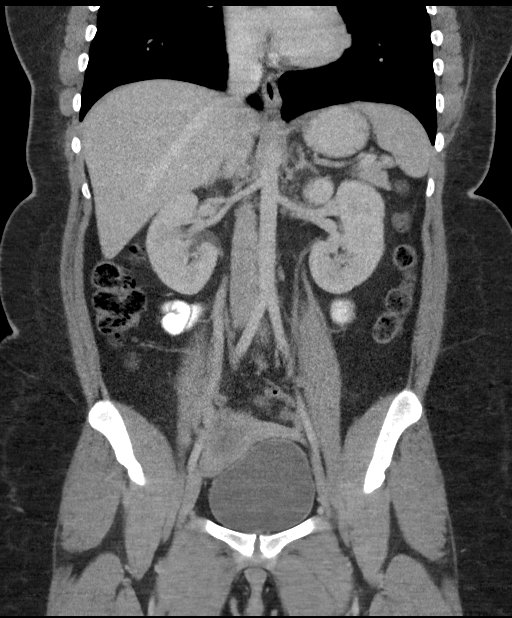

[15 of 46 positions shown; findings below may reference images not displayed]

FINDINGS: Lower chest: Lung bases are clear.

Hepatobiliary: No focal liver abnormality is seen. No gallstones,
gallbladder wall thickening, or biliary dilatation.

Pancreas: Unremarkable. No pancreatic ductal dilatation or
surrounding inflammatory changes.

Spleen: Normal in size without focal abnormality.

Adrenals/Urinary Tract: Adrenal glands are unremarkable. Kidneys are
normal, without renal calculi, focal lesion, or hydronephrosis.
Bladder is unremarkable.

Stomach/Bowel: Stomach, small bowel, and colon are not abnormally
distended. Diffusely stool-filled colon. Infiltration and edema
around the descending/sigmoid colon junction, likely representing
diverticulitis. No abscess.

Vascular/Lymphatic: No significant vascular findings are present. No
enlarged abdominal or pelvic lymph nodes.

Reproductive: Uterus is not enlarged. Cyst on the right ovary
measuring 6.1 cm diameter. Mildly increased density suggesting
hemorrhagic cyst. There is free fluid in the pelvis with increased
density measurements suggesting hemorrhagic free fluid. This may
result from rupture of a hemorrhagic cyst. Infected peritoneal fluid
due to peritonitis could also have this appearance. Consider
ultrasound of the pelvis for further evaluation.

Other: No free air in the abdomen. Abdominal wall musculature
appears intact.

Musculoskeletal: No acute or significant osseous findings.
IMPRESSION: 1. Infiltration and edema around the descending colon likely
representing acute diverticulitis. No abscess.
2. 6.1 cm diameter right ovarian cyst. Probably hemorrhagic free
fluid in the pelvis may result from rupture of a hemorrhagic cyst.
Infected peritoneal fluid due to peritonitis could also have this
appearance. Suggest ultrasound pelvis for further evaluation.

## 2019-08-10 ENCOUNTER — Other Ambulatory Visit: Payer: Self-pay | Admitting: Family Medicine

## 2019-08-10 MED FILL — ADVAIR 100/50 DISKUS: 100-50 | 30 days supply | Qty: 60 | Fill #0

## 2019-08-10 MED FILL — MONTELUKAST SOD 10 MG TAB: 10 | 30 days supply | Qty: 30 | Fill #0

## 2019-08-10 MED FILL — ALBUTEROL SULFATE HFA 108 (: 108 (90 BAS | 25 days supply | Qty: 18 | Fill #0

## 2019-08-10 NOTE — Telephone Encounter (Signed)
Requested medication (s) are due for refill today: Albuterol, yes  Requested medication (s) are on the active medication list: yes  Last refill:  04/03/19  Future visit scheduled: no  Notes to clinic:  No valid encounter within last 12 months  Requested medication (s) are due for refill today: Advair, yes  Requested medication (s) are on the active medication list: yes  Last refill:  03/25/19  Future visit scheduled: no  Notes to clinic:  no valid encounter within last 12 months  Requested medication (s) are due for refill today: Montelukast, yes  Requested medication (s) are on the active medication list: yes  Last refill:  8/28/220  Future visit scheduled: no  Notes to clinic:  no valid encounter within last 12 months  Requested Prescriptions  Pending Prescriptions Disp Refills   albuterol (VENTOLIN HFA) 108 (90 Base) MCG/ACT inhaler [Pharmacy Med Name: ALBUTEROL SULFATE HFA 108 ( 108 (90 BAS Aerosol] 18 g 0    Sig: INHALE 2 PUFFS BY MOUTH EVERY 6 HOURS AS NEEDED      Pulmonology:  Beta Agonists Failed - 08/10/2019  2:17 PM      Failed - One inhaler should last at least one month. If the patient is requesting refills earlier, contact the patient to check for uncontrolled symptoms.      Failed - Valid encounter within last 12 months    Recent Outpatient Visits           1 year ago Moderate persistent asthma without complication   [facility], Meda Coffee, MD                ADVAIR DISKUS 100-50 MCG/DOSE AEPB [Pharmacy Med Name: ADVAIR 100/50 DISKUS 100-50 Aerosol] 60 each 0    Sig: INHALE 1 PUFF BY MOUTH INTO THE LUNGS 2 TIMES DAILY.      Pulmonology:  Combination Products Failed - 08/10/2019  2:17 PM      Failed - Valid encounter within last 12 months    Recent Outpatient Visits           1 year ago Moderate persistent asthma without complication   [facility], Meda Coffee, MD                montelukast (SINGULAIR) 10 MG  tablet [Pharmacy Med Name: MONTELUKAST SOD 10 MG TAB 10 Tablet] 30 tablet 0    Sig: TAKE 1 TABLET BY MOUTH AT BEDTIME. NEEDS APPOINTMENT FOR FURTHER REFILLS      Pulmonology:  Leukotriene Inhibitors Failed - 08/10/2019  2:17 PM      Failed - Valid encounter within last 12 months    Recent Outpatient Visits           1 year ago Moderate persistent asthma without complication   [facility], Meda Coffee, MD

## 2019-09-08 IMAGING — CR DG CHEST 2V
2 series · 2 of 2 positions shown · non-contrast
Comparison: Chest radiograph 02/08/2014

CLINICAL DATA: Shortness of breath

EXAM:
CHEST - 2 VIEW

[w chest pa]
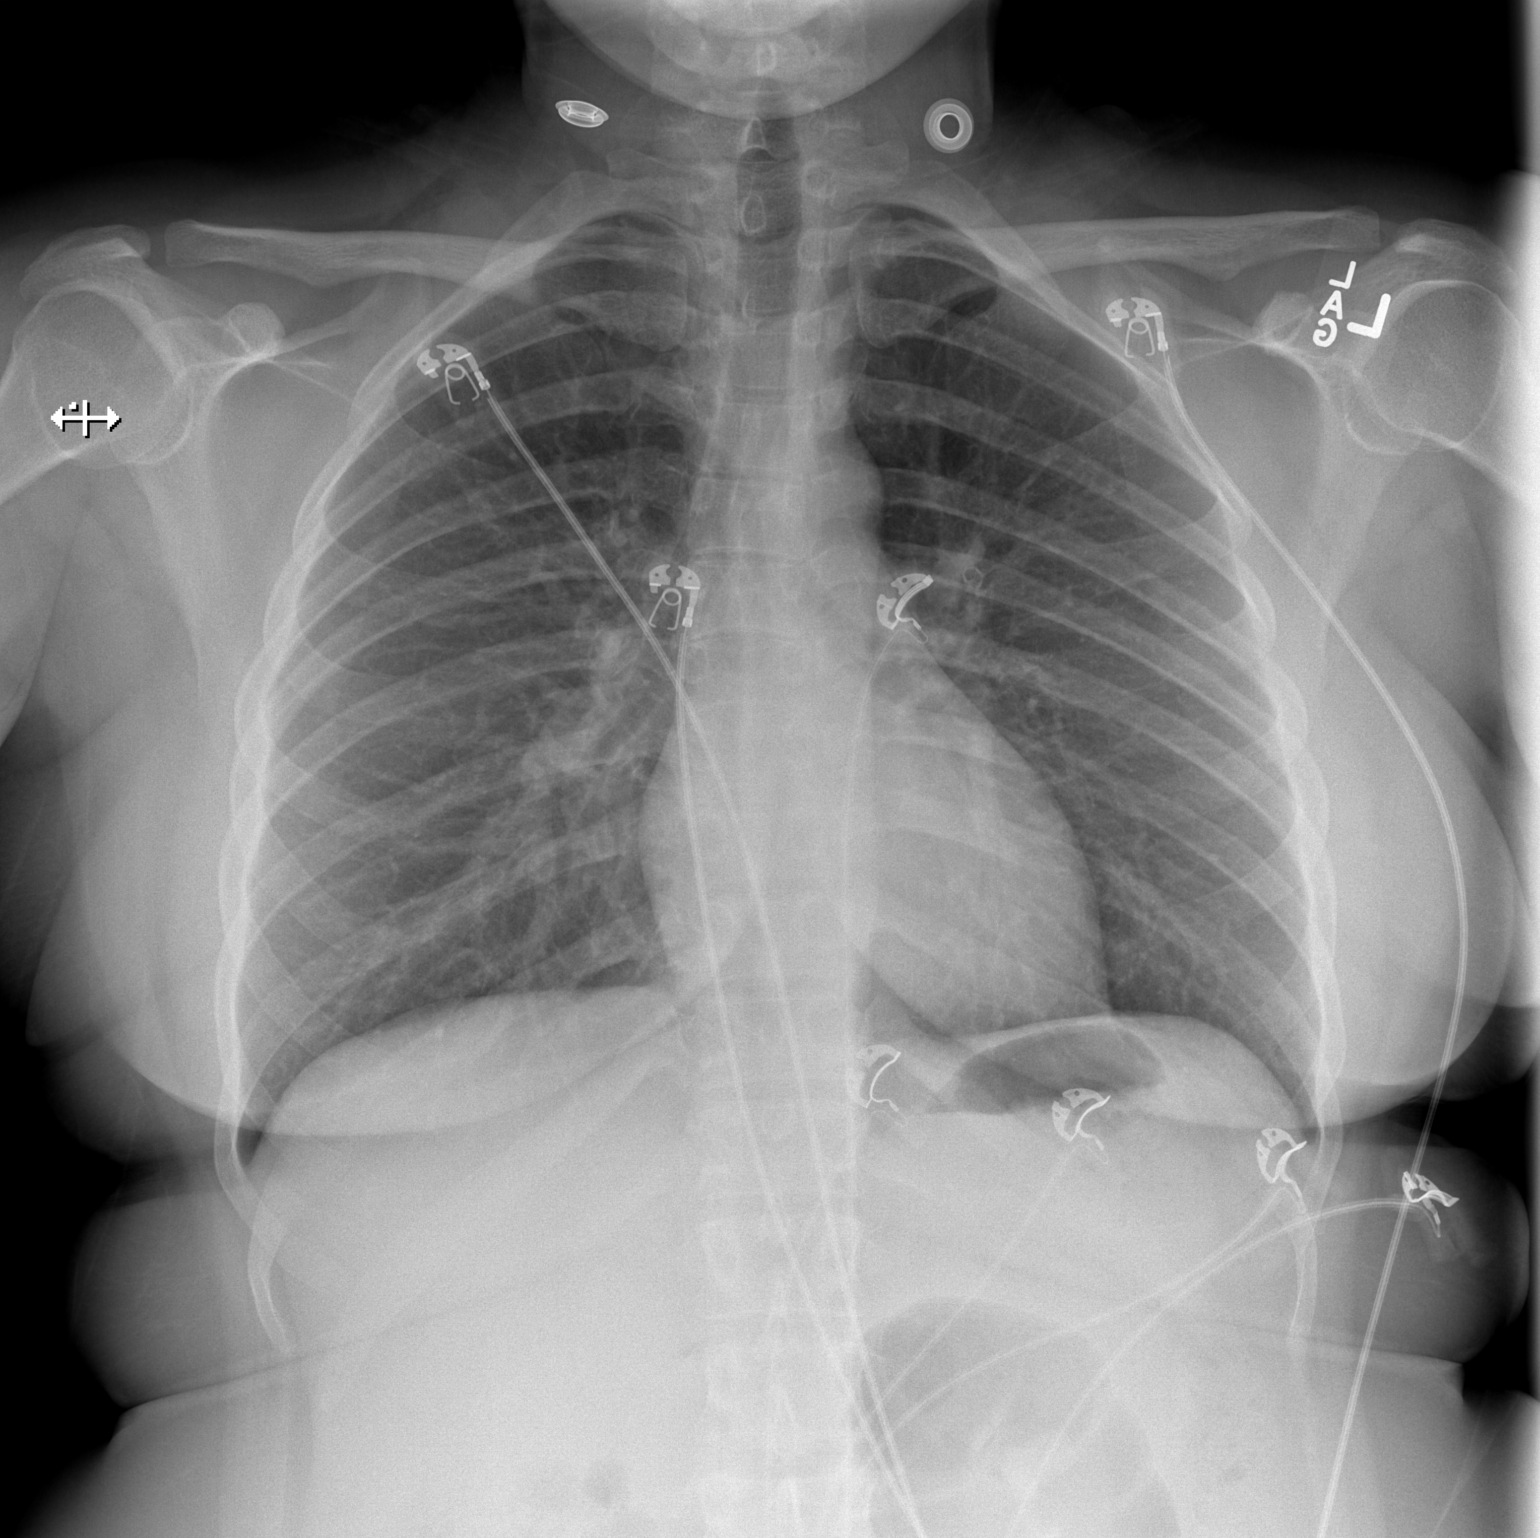

[w chest lat]
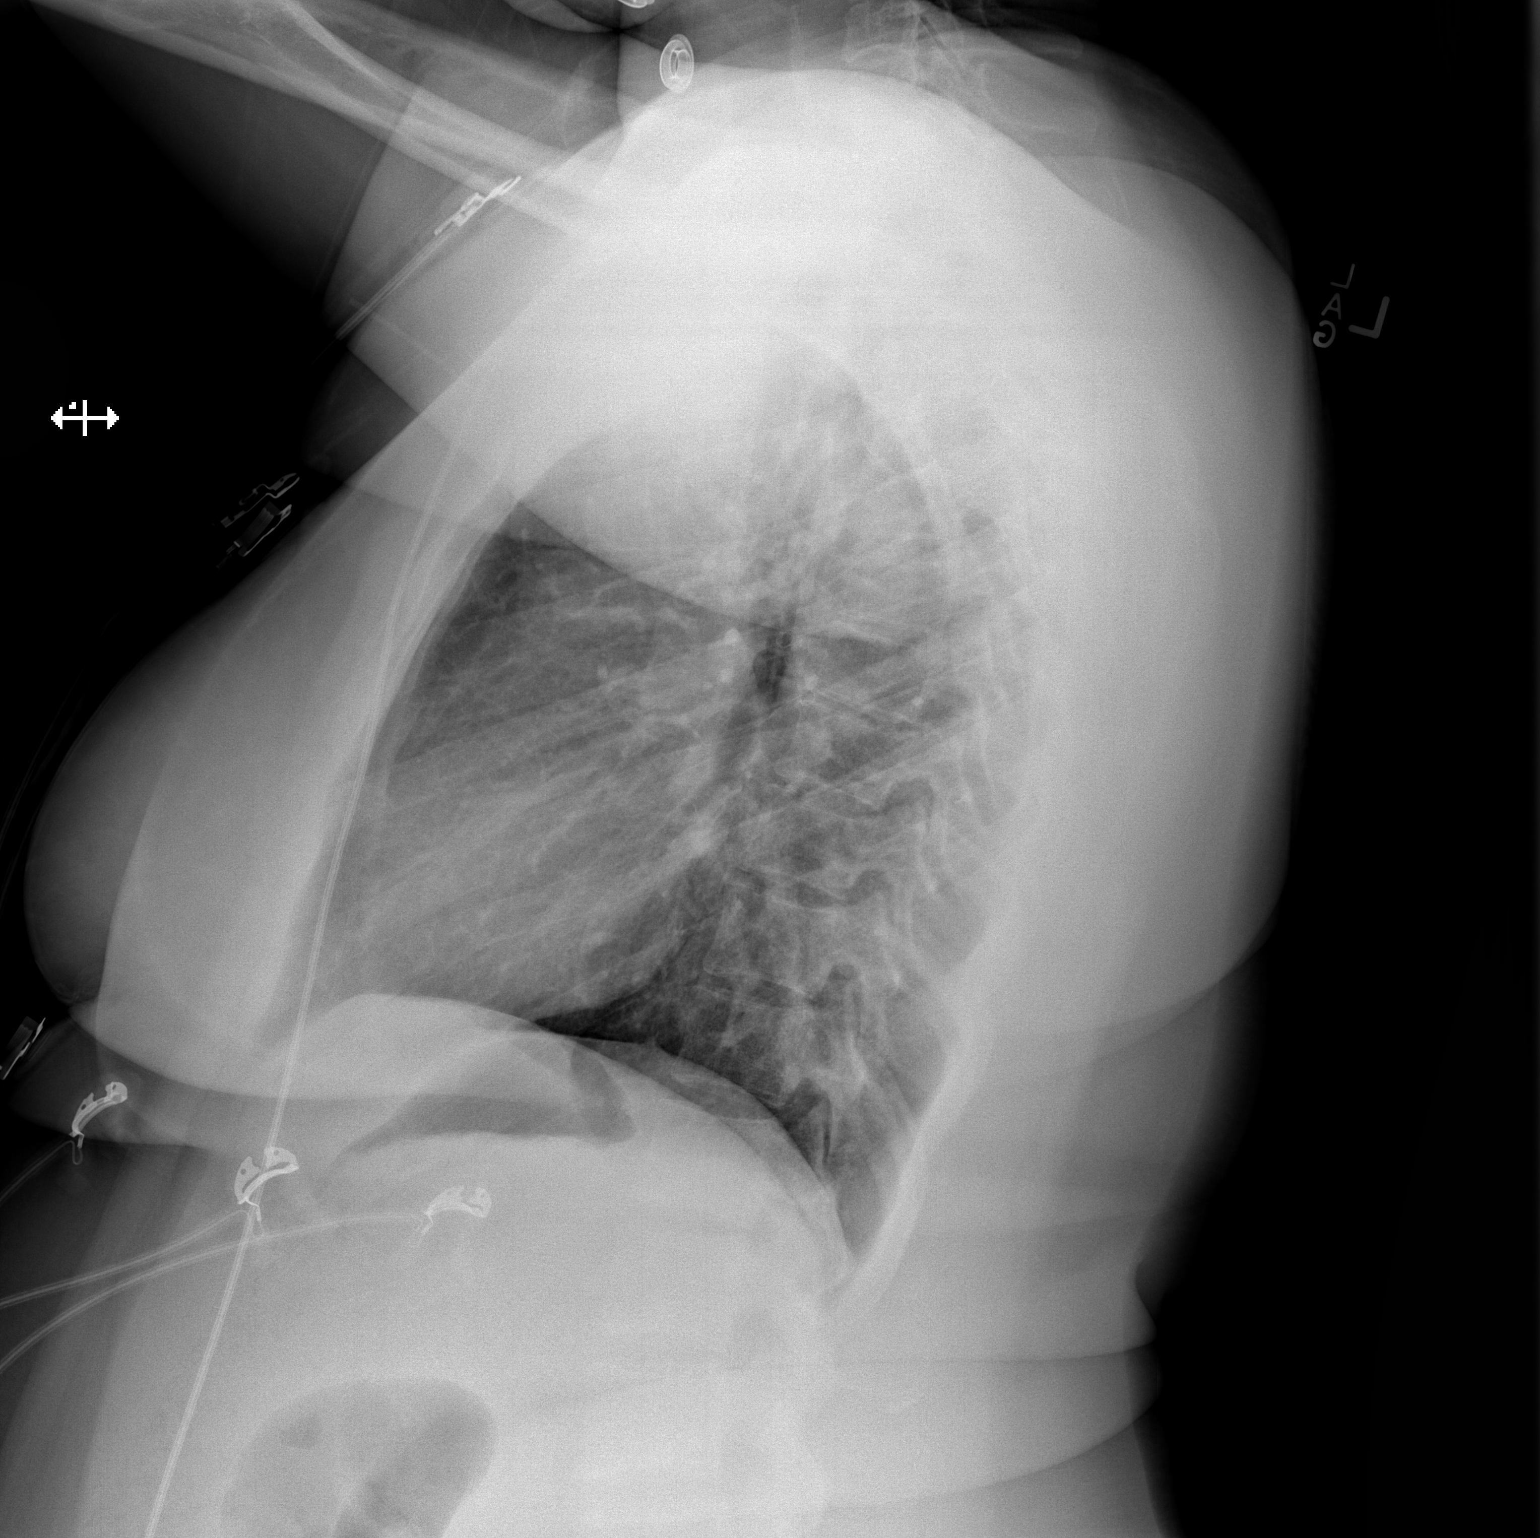

[2 of 2 positions shown; findings below may reference images not displayed]

FINDINGS: The heart size and mediastinal contours are within normal limits.
Both lungs are clear. The visualized skeletal structures are
unremarkable.
IMPRESSION: No active cardiopulmonary disease.

## 2020-08-19 ENCOUNTER — Emergency Department (HOSPITAL_COMMUNITY)
Admission: EM | Admit: 2020-08-19 | Discharge: 2020-08-19 | Disposition: A | Discharge status: Home / Self Care | Payer: 59 | Attending: Emergency Medicine | Admitting: Emergency Medicine

## 2020-08-19 ENCOUNTER — Other Ambulatory Visit: Payer: Self-pay

## 2020-08-19 DIAGNOSIS — J4540 Moderate persistent asthma, uncomplicated: Secondary | ICD-10-CM | POA: Diagnosis not present

## 2020-08-19 DIAGNOSIS — Z23 Encounter for immunization: Secondary | ICD-10-CM | POA: Insufficient documentation

## 2020-08-19 DIAGNOSIS — L02411 Cutaneous abscess of right axilla: Secondary | ICD-10-CM | POA: Diagnosis not present

## 2020-08-19 MED ORDER — SULFAMETHOXAZOLE-TRIMETHOPRIM 800-160 MG PO TABS: 1.0000 | ORAL_TABLET | Freq: Two times a day (BID) | ORAL | 0 refills | Status: AC

## 2020-08-19 MED ORDER — LIDOCAINE-EPINEPHRINE 2 %-1:100000 IJ SOLN
20.0000 mL | Freq: Once | INTRAMUSCULAR | Status: AC
  Administered 2020-08-19: 20 mL
  Filled 2020-08-19: qty 1

## 2020-08-19 MED ORDER — TETANUS-DIPHTH-ACELL PERTUSSIS 5-2.5-18.5 LF-MCG/0.5 IM SUSY
0.5000 mL | PREFILLED_SYRINGE | Freq: Once | INTRAMUSCULAR | Status: AC
  Administered 2020-08-19: 0.5 mL via INTRAMUSCULAR
  Filled 2020-08-19: qty 0.5

## 2020-08-19 NOTE — ED Provider Notes (Signed)
Emergency Medicine Provider Triage Evaluation Note   Erin [name] , a 30 y.o. female  was evaluated in triage.  Pt complains of abscess.  Review of Systems  Positive: Abscess R axillary fold Negative: Fever, cp, injury  Physical Exam  BP 131/88 (BP Location: Left Arm)   Pulse 99   Temp 98.6 F (37 C) (Oral)   Resp 17   Ht 5\' 3"  (1.6 m)   Wt 105.7 kg   SpO2 98%   BMI 41.27 kg/m  Gen:   Awake, no distress   Resp:  Normal effort  MSK:   Moves extremities without difficulty  Other:  R axillary, area of induration, fluctuance with tenderness to palpation.  No erythema  Medical Decision Making  Medically screening exam initiated at 8:58 PM.  Appropriate orders placed.  Erin [name] was informed that the remainder of the evaluation will be completed by another provider, this initial triage assessment does not replace that evaluation, and the importance of remaining in the ED until their evaluation is complete.  Pt developed abscess to R axillary fold x 3 days.  Unknown tetanus status.     Ebony Hail, PA-C 08/19/20 2102    2103, MD 08/19/20 2243

## 2020-08-19 NOTE — ED Triage Notes (Signed)
Patient complaining of abscess under right arm.

## 2020-08-19 NOTE — ED Provider Notes (Signed)
[facility]-EMERGENCY DEPT Provider Note   CSN: [id number] Arrival date & time: 08/19/20  2033     History Chief Complaint  Patient presents with  . Abscess    Erin [name] is a 30 y.o. female.  The history is provided by the patient. No language interpreter was used.  Abscess Associated symptoms: no fever and no headaches      30 year old female presenting for evaluation of an abscess.  For the past 3 days patient reported having increasing pain and swelling and tenderness about her right armpit.  Pain worse with arm movement or with palpation.  Pain is throbbing, moderate to severe, nonradiating.  No fever chills chest pain shortness of breath no injury.  No history of abscess in the past.  No specific treatment tried.  She is not up-to-date with tetanus.  Past Medical History:  Diagnosis Date  . Asthma   . Miscarriage     Patient Active Problem List   Diagnosis Date Noted  . Moderate persistent asthma without complication 06/12/2018  . Major depressive disorder, single episode, moderate (HCC) 06/12/2018    Past Surgical History:  Procedure Laterality Date  . TAB       OB History    Gravida  3   Para  1   Term  1   Preterm  0   AB  2   Living  1     SAB  1   IAB  1   Ectopic  0   Multiple  0   Live Births  1           Family History  Problem Relation Age of Onset  . Hyperlipidemia Father   . Hypertension Father   . Cancer Paternal Aunt   . Cancer Maternal Grandfather   . Anesthesia problems Neg Hx   . Hypotension Neg Hx   . Malignant hyperthermia Neg Hx   . Pseudochol deficiency Neg Hx     Social History   Tobacco Use  . Smoking status: Never Smoker  . Smokeless tobacco: Never Used  Vaping Use  . Vaping Use: Never used  Substance Use Topics  . Alcohol use: No  . Drug use: No    Home Medications Prior to Admission medications   Medication Sig Start Date End Date Taking? Authorizing Provider  ADVAIR  DISKUS 100-50 MCG/DOSE AEPB INHALE 1 PUFF BY MOUTH INTO THE LUNGS 2 TIMES DAILY. 08/10/19   Lezlie Lye, Meda Coffee, MD  albuterol (VENTOLIN HFA) 108 [phone] Base) MCG/ACT inhaler INHALE 2 PUFFS BY MOUTH EVERY 6 HOURS AS NEEDED 08/10/19   Lezlie Lye, Meda Coffee, MD  Albuterol Sulfate (PROAIR RESPICLICK) 108 (90 Base) MCG/ACT AEPB Inhale 2 puffs into the lungs every 6 (six) hours as needed. 03/25/19   Noni Saupe, MD  cetirizine (ZYRTEC) 10 MG tablet Take 10 mg by mouth daily.    [provider]  montelukast (SINGULAIR) 10 MG tablet TAKE 1 TABLET BY MOUTH AT BEDTIME. NEEDS APPOINTMENT FOR FURTHER REFILLS 08/10/19   Lezlie Lye, Meda Coffee, MD    Allergies    Marijuana (cannabis sativa) and Naproxen sodium  Review of Systems   Review of Systems  Constitutional: Negative for fever.  Cardiovascular: Negative for chest pain.  Skin: Negative for rash and wound.  Neurological: Negative for headaches.    Physical Exam Updated Vital Signs BP 131/88 (BP Location: Left Arm)   Pulse 99   Temp 98.6 F (37 C) (Oral)  Resp 17   Ht 5\' 3"  (1.6 m)   Wt 105.7 kg   SpO2 98%   BMI 41.27 kg/m   Physical Exam Vitals and nursing note reviewed.  Constitutional:      General: She is not in acute distress.    Appearance: She is well-developed.  HENT:     Head: Atraumatic.  Eyes:     Conjunctiva/sclera: Conjunctivae normal.  Musculoskeletal:        General: Tenderness (Right axillary fold: An area of induration with minimal fluctuance noted and squeezed tenderness to palpation.  Area is approximately 3 cm in diameter.  No surrounding skin erythema.) present.     Cervical back: Neck supple.  Skin:    Findings: No rash.  Neurological:     Mental Status: She is alert.     ED Results / Procedures / Treatments   Labs (all labs ordered are listed, but only abnormal results are displayed) Labs Reviewed - No data to display  EKG None  Radiology No results found.  Procedures . Incision  and Drainage  Date/Time: 08/19/2020 10:07 PM Performed by: 10/19/2020, PA-C Authorized by: Fayrene Helper, PA-C   Consent:    Consent obtained:  Verbal   Consent given by:  Patient   Risks discussed:  Bleeding, incomplete drainage, pain and damage to other organs   Alternatives discussed:  No treatment Universal protocol:    Procedure explained and questions answered to patient or proxy's satisfaction: yes     Relevant documents present and verified: yes     Test results available : yes     Imaging studies available: yes     Required blood products, implants, devices, and special equipment available: yes     Site/side marked: yes     Immediately prior to procedure, a time out was called: yes     Patient identity confirmed:  Verbally with patient Location:    Type:  Abscess   Size:  3   Location:  Upper extremity   Upper extremity location: right axillary fold. Pre-procedure details:    Skin preparation:  Betadine Anesthesia:    Anesthesia method:  Local infiltration   Local anesthetic:  Lidocaine 1% WITH epi Procedure type:    Complexity:  Simple Procedure details:    Incision types:  Single straight   Incision depth:  Subcutaneous   Wound management:  Probed and deloculated, irrigated with saline and extensive cleaning   Drainage:  Purulent   Drainage amount:  Scant   Packing materials:  1/4 in gauze Post-procedure details:    Procedure completion:  Tolerated well, no immediate complications     Medications Ordered in ED Medications  Tdap (BOOSTRIX) injection 0.5 mL (has no administration in time range)  lidocaine-EPINEPHrine (XYLOCAINE W/EPI) 2 %-1:100000 (with pres) injection 20 mL (has no administration in time range)    ED Course  I have reviewed the triage vital signs and the nursing notes.  Pertinent labs & imaging results that were available during my care of the patient were reviewed by me and considered in my medical decision making (see chart for  details).    MDM Rules/Calculators/A&P                          BP 131/88 (BP Location: Left Arm)   Pulse 99   Temp 98.6 F (37 C) (Oral)   Resp 17   Ht 5\' 3"  (1.6 m)   Wt 105.7 kg  SpO2 98%   BMI 41.27 kg/m   Final Clinical Impression(s) / ED Diagnoses Final diagnoses:  Cutaneous abscess of right axilla    Rx / DC Orders ED Discharge Orders         Ordered    sulfamethoxazole-trimethoprim (BACTRIM DS) 800-160 MG tablet  2 times daily        08/19/20 2209         10:08 PM Pt with cutaneous abscess to R axillary fold.  I performed I&D and packing placed.  Will d/c home with abx.  Pt is currently not pregnant.  Packing remove in 2 days.  Return precaution given.  I have updated tdap.    Fayrene Helper, PA-C 08/19/20 2210    Bethann Berkshire, MD 08/19/20 2244

## 2020-08-19 NOTE — ED Notes (Signed)
Lidocaine bedside 

## 2020-08-19 NOTE — Discharge Instructions (Signed)
Please apply warm moist compress to affected area several times daily to encourage healing.  Remove packing in 2 days by pulling on the packing.  Take antibiotic as prescribed.  Return if you have any concerns.

## 2022-02-11 ENCOUNTER — Ambulatory Visit (INDEPENDENT_AMBULATORY_CARE_PROVIDER_SITE_OTHER): Payer: Commercial Managed Care - HMO

## 2022-02-11 ENCOUNTER — Ambulatory Visit
Admission: EM | Admit: 2022-02-11 | Discharge: 2022-02-11 | Disposition: A | Discharge status: Home / Self Care | Payer: Commercial Managed Care - HMO | Attending: Urgent Care | Admitting: Urgent Care

## 2022-02-11 DIAGNOSIS — J4541 Moderate persistent asthma with (acute) exacerbation: Secondary | ICD-10-CM

## 2022-02-11 DIAGNOSIS — R059 Cough, unspecified: Secondary | ICD-10-CM | POA: Diagnosis not present

## 2022-02-11 DIAGNOSIS — R0789 Other chest pain: Secondary | ICD-10-CM

## 2022-02-11 MED ORDER — ALBUTEROL SULFATE HFA 108 (90 BASE) MCG/ACT IN AERS: 1.0000 | INHALATION_SPRAY | Freq: Four times a day (QID) | RESPIRATORY_TRACT | 0 refills | Status: DC | PRN

## 2022-02-11 MED ORDER — ALBUTEROL SULFATE (2.5 MG/3ML) 0.083% IN NEBU: 2.5000 mg | INHALATION_SOLUTION | RESPIRATORY_TRACT | 0 refills | Status: AC | PRN

## 2022-02-11 MED ORDER — PREDNISONE 50 MG PO TABS: 50.0000 mg | ORAL_TABLET | Freq: Every day | ORAL | 0 refills | Status: DC

## 2022-02-11 MED ORDER — FLUTICASONE-SALMETEROL 100-50 MCG/ACT IN AEPB: INHALATION_SPRAY | RESPIRATORY_TRACT | 2 refills | Status: DC

## 2022-02-11 MED ORDER — ALBUTEROL SULFATE (2.5 MG/3ML) 0.083% IN NEBU
2.5000 mg | INHALATION_SOLUTION | Freq: Once | RESPIRATORY_TRACT | Status: AC
  Administered 2022-02-11: 2.5 mg via RESPIRATORY_TRACT

## 2022-02-11 NOTE — ED Provider Notes (Signed)
[name]  Note:  This document was prepared using Conservation officer, historic buildings and may include unintentional dictation errors.  MRN: [id number] DOB: Aug 12, 1990  Subjective:   Erin [name] is a 31 y.o. female presenting for 1 month history of persistent and worsening wheezing, chest tightness, coughing, malaise and fatigue.  No fever, overt chest pain.  However, she does have concerns about pneumonia as a family member was hospitalized within the past month for this and she did go visit them regularly.  She also ran out of her Advair and needs refills on her Ventolin and albuterol inhaler, nebulized albuterol.  Has not had treatments for 3 days.  Has done multiple COVID tests and all have been negative.  No current facility-administered medications for this encounter.  Current Outpatient Medications:    ADVAIR DISKUS 100-50 MCG/DOSE AEPB, INHALE 1 PUFF BY MOUTH INTO THE LUNGS 2 TIMES DAILY., Disp: 60 each, Rfl: 0   albuterol (VENTOLIN HFA) 108 (90 Base) MCG/ACT inhaler, INHALE 2 PUFFS BY MOUTH EVERY 6 HOURS AS NEEDED, Disp: 18 g, Rfl: 0   Albuterol Sulfate (PROAIR RESPICLICK) 108 (90 Base) MCG/ACT AEPB, Inhale 2 puffs into the lungs every 6 (six) hours as needed., Disp: 1 each, Rfl: 0   cetirizine (ZYRTEC) 10 MG tablet, Take 10 mg by mouth daily., Disp: , Rfl:    montelukast (SINGULAIR) 10 MG tablet, TAKE 1 TABLET BY MOUTH AT BEDTIME. NEEDS APPOINTMENT FOR FURTHER REFILLS, Disp: 30 tablet, Rfl: 0   Allergies  Allergen Reactions   Marijuana (Cannabis Sativa) Anaphylaxis, Hives and Swelling    Dx in [place] at the age of 89   Naproxen Sodium     Difficulty with imbalance;     Past Medical History:  Diagnosis Date   Asthma    Miscarriage      Past Surgical History:  Procedure Laterality Date   TAB      Family History  Problem Relation Age of Onset   Hyperlipidemia Father    Hypertension Father    Cancer Paternal Aunt    Cancer Maternal  Grandfather    Anesthesia problems Neg Hx    Hypotension Neg Hx    Malignant hyperthermia Neg Hx    Pseudochol deficiency Neg Hx     Social History   Tobacco Use   Smoking status: Never   Smokeless tobacco: Never  Vaping Use   Vaping Use: Never used  Substance Use Topics   Alcohol use: No   Drug use: No    ROS   Objective:   Vitals: BP 120/83 (BP Location: Right Arm)   Pulse (!) 101   Temp 98.6 F (37 C) (Oral)   Resp 20   SpO2 95%   Physical Exam Constitutional:      General: She is not in acute distress.    Appearance: Normal appearance. She is well-developed. She is not ill-appearing, toxic-appearing or diaphoretic.  HENT:     Head: Normocephalic and atraumatic.     Nose: Nose normal.     Mouth/Throat:     Mouth: Mucous membranes are moist.     Pharynx: No pharyngeal swelling, oropharyngeal exudate, posterior oropharyngeal erythema or uvula swelling.     Tonsils: No tonsillar exudate or tonsillar abscesses. 0 on the right. 0 on the left.  Eyes:     General: No scleral icterus.       Right eye: No discharge.        Left eye: No discharge.  Extraocular Movements: Extraocular movements intact.  Cardiovascular:     Rate and Rhythm: Normal rate and regular rhythm.     Heart sounds: Normal heart sounds. No murmur heard.    No friction rub. No gallop.  Pulmonary:     Effort: Pulmonary effort is normal. No respiratory distress.     Breath sounds: No stridor. Examination of the right-middle field reveals wheezing. Examination of the left-middle field reveals wheezing. Examination of the left-lower field reveals wheezing. Wheezing (mild) present. No rhonchi or rales.  Chest:     Chest wall: No tenderness.  Skin:    General: Skin is warm and dry.  Neurological:     General: No focal deficit present.     Mental Status: She is alert and oriented to person, place, and time.  Psychiatric:        Mood and Affect: Mood normal.        Behavior: Behavior normal.     Radiology overread did not crossover.  Was negative.  Albuterol nebulizer treatment was given at 2.5 mg.   Assessment and Plan :   PDMP not reviewed this encounter.  1. Moderate persistent asthma with acute exacerbation   2. Atypical chest pain     Nebulizer treatment provided as above with improvement in breathing.  Chest x-ray negative.  Recommended oral prednisone course for acute asthma exacerbation.  Patient is otherwise hemodynamically stable.  I refilled her Advair, Ventolin and nebulized albuterol.  Continue to follow-up with PCP and/or pulmonologist. Counseled patient on potential for adverse effects with medications prescribed/recommended today, ER and return-to-clinic precautions discussed, patient verbalized understanding.    Wallis Bamberg, PA-C 02/11/22 1246

## 2022-02-11 NOTE — ED Triage Notes (Addendum)
Pt c/o increase in wheezing and "asthma" x 1 month after she ran out of steroid inhaler-states she ran out of albuterol inhaler 3 days ago-reports neg covid test x 2-NAD-steady gait

## 2022-06-06 ENCOUNTER — Ambulatory Visit
Admission: EM | Admit: 2022-06-06 | Discharge: 2022-06-06 | Disposition: A | Discharge status: Home / Self Care | Payer: 59 | Attending: Urgent Care | Admitting: Urgent Care

## 2022-06-06 DIAGNOSIS — Z7952 Long term (current) use of systemic steroids: Secondary | ICD-10-CM | POA: Diagnosis not present

## 2022-06-06 DIAGNOSIS — J4541 Moderate persistent asthma with (acute) exacerbation: Secondary | ICD-10-CM

## 2022-06-06 MED ORDER — PREDNISONE 50 MG PO TABS: 50.0000 mg | ORAL_TABLET | Freq: Every day | ORAL | 0 refills | Status: DC

## 2022-06-06 MED ORDER — FLUTICASONE-SALMETEROL 100-50 MCG/ACT IN AEPB: INHALATION_SPRAY | RESPIRATORY_TRACT | 2 refills | Status: DC

## 2022-06-06 MED ORDER — ALBUTEROL SULFATE HFA 108 (90 BASE) MCG/ACT IN AERS: 1.0000 | INHALATION_SPRAY | Freq: Four times a day (QID) | RESPIRATORY_TRACT | 0 refills | Status: DC | PRN

## 2022-06-06 NOTE — ED Provider Notes (Signed)
[name]  Note:  This document was prepared using Systems analyst and may include unintentional dictation errors.  MRN: [id number] DOB: 1990/08/18  Subjective:   Erin [name] is a 32 y.o. female presenting for 1 week history of persistent recurrent wheezing, shortness of breath, chest tightness.  She did a COVID and flu test at work and both were negative.  She is out of her medications for Advair, albuterol and would like a refill.  No current facility-administered medications for this encounter.  Current Outpatient Medications:    albuterol (PROVENTIL) (2.5 MG/3ML) 0.083% nebulizer solution, Take 3 mLs (2.5 mg total) by nebulization every 4 (four) hours as needed for wheezing or shortness of breath., Disp: 75 mL, Rfl: 0   albuterol (VENTOLIN HFA) 108 (90 Base) MCG/ACT inhaler, Inhale 1-2 puffs into the lungs every 6 (six) hours as needed for wheezing or shortness of breath., Disp: 18 g, Rfl: 0   cetirizine (ZYRTEC) 10 MG tablet, Take 10 mg by mouth daily., Disp: , Rfl:    fluticasone-salmeterol (ADVAIR DISKUS) 100-50 MCG/ACT AEPB, INHALE 1 PUFF BY MOUTH INTO THE LUNGS 2 TIMES DAILY., Disp: 60 each, Rfl: 2   montelukast (SINGULAIR) 10 MG tablet, TAKE 1 TABLET BY MOUTH AT BEDTIME. NEEDS APPOINTMENT FOR FURTHER REFILLS, Disp: 30 tablet, Rfl: 0   predniSONE (DELTASONE) 50 MG tablet, Take 1 tablet (50 mg total) by mouth daily with breakfast., Disp: 5 tablet, Rfl: 0   Allergies  Allergen Reactions   Marijuana (Cannabis Sativa) Anaphylaxis, Hives and Swelling    Dx in [place] at the age of 29   Naproxen Sodium     Difficulty with imbalance;     Past Medical History:  Diagnosis Date   Asthma    Miscarriage      Past Surgical History:  Procedure Laterality Date   TAB      Family History  Problem Relation Age of Onset   Hyperlipidemia Father    Hypertension Father    Cancer Paternal Aunt    Cancer Maternal Grandfather    Anesthesia  problems Neg Hx    Hypotension Neg Hx    Malignant hyperthermia Neg Hx    Pseudochol deficiency Neg Hx     Social History   Tobacco Use   Smoking status: Never   Smokeless tobacco: Never  Vaping Use   Vaping Use: Never used  Substance Use Topics   Alcohol use: No   Drug use: No    ROS   Objective:   Vitals: BP 120/82 (BP Location: Left Arm)   Pulse 93   Temp 98.8 F (37.1 C) (Oral)   Resp 16   SpO2 98%   Physical Exam Constitutional:      General: She is not in acute distress.    Appearance: Normal appearance. She is well-developed. She is not ill-appearing, toxic-appearing or diaphoretic.  HENT:     Head: Normocephalic and atraumatic.     Nose: Nose normal.     Mouth/Throat:     Mouth: Mucous membranes are moist.  Eyes:     General: No scleral icterus.       Right eye: No discharge.        Left eye: No discharge.     Extraocular Movements: Extraocular movements intact.  Cardiovascular:     Rate and Rhythm: Normal rate and regular rhythm.     Heart sounds: Normal heart sounds. No murmur heard.    No friction rub. No gallop.  Pulmonary:     Effort: Pulmonary effort is normal. No respiratory distress.     Breath sounds: No stridor. Examination of the right-upper field reveals wheezing. Examination of the left-upper field reveals wheezing. Examination of the right-middle field reveals wheezing. Examination of the left-middle field reveals wheezing. Examination of the right-lower field reveals wheezing. Examination of the left-lower field reveals wheezing. Wheezing present. No decreased breath sounds, rhonchi or rales.  Chest:     Chest wall: No tenderness.  Skin:    General: Skin is warm and dry.  Neurological:     General: No focal deficit present.     Mental Status: She is alert and oriented to person, place, and time.  Psychiatric:        Mood and Affect: Mood normal.        Behavior: Behavior normal.     Assessment and Plan :   PDMP not reviewed  this encounter.  1. Moderate persistent asthma dependent on systemic steroids with acute exacerbation     Patient had mild wheezing without any adventitious lung sounds.  Otherwise vital signs are hemodynamically stable.  As such, deferred chest x-ray and will focus on her asthma.  Refilled Advair and requested the pharmacy filled the covered medication whether brand-name or generic.  Refilled albuterol.  Recommended a oral prednisone course given her respiratory symptoms. Counseled patient on potential for adverse effects with medications prescribed/recommended today, ER and return-to-clinic precautions discussed, patient verbalized understanding.    Erin [name], [place] 06/06/22 [id number]

## 2022-06-06 NOTE — ED Triage Notes (Signed)
Pt states sob and wheezing for the past week states she is out of her advair and albuterol inhalers.

## 2022-06-27 ENCOUNTER — Ambulatory Visit (INDEPENDENT_AMBULATORY_CARE_PROVIDER_SITE_OTHER): Payer: 59 | Admitting: Nurse Practitioner

## 2022-06-27 ENCOUNTER — Encounter: Payer: Self-pay | Admitting: Nurse Practitioner

## 2022-06-27 VITALS — BP 122/70 | HR 90 | Temp 99.0°F | Ht 63.0 in | Wt 242.8 lb

## 2022-06-27 DIAGNOSIS — Z1329 Encounter for screening for other suspected endocrine disorder: Secondary | ICD-10-CM

## 2022-06-27 DIAGNOSIS — Z3009 Encounter for other general counseling and advice on contraception: Secondary | ICD-10-CM | POA: Insufficient documentation

## 2022-06-27 DIAGNOSIS — F3341 Major depressive disorder, recurrent, in partial remission: Secondary | ICD-10-CM

## 2022-06-27 DIAGNOSIS — Z6841 Body Mass Index (BMI) 40.0 and over, adult: Secondary | ICD-10-CM

## 2022-06-27 DIAGNOSIS — Z124 Encounter for screening for malignant neoplasm of cervix: Secondary | ICD-10-CM

## 2022-06-27 DIAGNOSIS — Z13220 Encounter for screening for lipoid disorders: Secondary | ICD-10-CM | POA: Diagnosis not present

## 2022-06-27 DIAGNOSIS — J4540 Moderate persistent asthma, uncomplicated: Secondary | ICD-10-CM

## 2022-06-27 DIAGNOSIS — L219 Seborrheic dermatitis, unspecified: Secondary | ICD-10-CM | POA: Diagnosis not present

## 2022-06-27 MED ORDER — KETOCONAZOLE 1 % EX SHAM: MEDICATED_SHAMPOO | CUTANEOUS | 2 refills | Status: DC

## 2022-06-27 NOTE — Assessment & Plan Note (Addendum)
Chronic. PHQ- 17 and GAD- 8. Patient is not interested in medication or counseling at this time. Denies SI/HI. She believes her depression is due to her weight, she is working to try and lose weight. Encouraged to contact if worsening symptoms, unusual behavior changes or suicidal thoughts occur. Will continue to monitor.

## 2022-06-27 NOTE — Progress Notes (Unsigned)
[name], NP-C Phone: [phone]  Erin [name] is a 32 y.o. female who presents today to establish care. Past medical history includes asthma and depression. She would like to discuss non hormonal birth control options     Active Ambulatory Problems    Diagnosis Date Noted   Moderate persistent asthma without complication 06/12/2018   Major depressive disorder 06/12/2018   Morbid obesity with BMI of 40.0-44.9, adult 06/27/2022   Seborrheic dermatitis of scalp 06/27/2022   Encounter for general counseling and advice on contraceptive management 06/27/2022   Resolved Ambulatory Problems    Diagnosis Date Noted   No Resolved Ambulatory Problems   Past Medical History:  Diagnosis Date   Asthma    Depression    Miscarriage     Family History  Problem Relation Age of Onset   Cancer Mother    Kidney disease Father    Heart disease Father    Hyperlipidemia Father    Hypertension Father    Miscarriages / [place] Sister    Cancer Paternal Aunt    Early death Maternal Grandmother    Early death Maternal Grandfather    Cancer Maternal Grandfather    Hypertension Paternal Grandmother    Hypertension Paternal Grandfather    Anesthesia problems Neg Hx    Hypotension Neg Hx    Malignant hyperthermia Neg Hx    Pseudochol deficiency Neg Hx     Social History   Socioeconomic History   Marital status: Single    Spouse name: Not on file   Number of children: Not on file   Years of education: Not on file   Highest education level: Not on file  Occupational History   Not on file  Tobacco Use   Smoking status: Never   Smokeless tobacco: Never  Vaping Use   Vaping Use: Never used  Substance and Sexual Activity   Alcohol use: No   Drug use: No   Sexual activity: Yes    Birth control/protection: Implant  Other Topics Concern   Not on file  Social History Narrative   Not on file   Social Determinants of Health   Financial Resource Strain: Not on file  Food  Insecurity: Not on file  Transportation Needs: Not on file  Physical Activity: Not on file  Stress: Not on file  Social Connections: Not on file  Intimate Partner Violence: Not on file    ROS  General:  Negative for unexplained weight loss, fever Skin: Negative for new or changing mole, sore that won't heal HEENT: Negative for trouble hearing, trouble seeing, ringing in ears, mouth sores, hoarseness, change in voice, dysphagia. CV:  Negative for chest pain, dyspnea, edema, palpitations Resp: Negative for cough, dyspnea, hemoptysis GI: Negative for nausea, vomiting, diarrhea, constipation, abdominal pain, melena, hematochezia. GU: Negative for dysuria, incontinence, urinary hesitance, hematuria, vaginal or penile discharge, polyuria, sexual difficulty, lumps in testicle or breasts MSK: Negative for muscle cramps or aches, joint pain or swelling Neuro: Negative for headaches, weakness, numbness, dizziness, passing out/fainting Psych: Negative for anxiety, memory problems  Objective  Physical Exam Vitals:   06/27/22 1515  BP: 122/70  Pulse: 90  Temp: 99 F (37.2 C)  SpO2: 97%    BP Readings from Last 3 Encounters:  06/27/22 122/70  06/06/22 120/82  02/11/22 120/83   Wt Readings from Last 3 Encounters:  06/27/22 242 lb 12.8 oz (110.1 kg)  08/19/20 233 lb (105.7 kg)  02/10/18 227 lb 3.2 oz (103.1 kg)  Physical Exam Constitutional:      General: She is not in acute distress.    Appearance: Normal appearance.  HENT:     Head: Normocephalic.  Cardiovascular:     Rate and Rhythm: Normal rate and regular rhythm.     Heart sounds: Normal heart sounds.  Pulmonary:     Effort: Pulmonary effort is normal.     Breath sounds: Normal breath sounds.  Skin:    General: Skin is warm and dry.  Neurological:     General: No focal deficit present.     Mental Status: She is alert.  Psychiatric:        Mood and Affect: Mood normal.        Behavior: Behavior normal.     Assessment/Plan:   Morbid obesity with BMI of 40.0-44.9, adult -     Hemoglobin A1c  Recurrent major depressive disorder, in partial remission Assessment & Plan: Chronic. PHQ- 17 and GAD- 8. Patient is not interested in medication or counseling at this time. Denies SI/HI. She believes her depression is due to her weight, she is working to try and lose weight. Encouraged to contact if worsening symptoms, unusual behavior changes or suicidal thoughts occur. Will continue to monitor.   Orders: -     CBC with Differential/Platelet -     Comprehensive metabolic panel  Seborrheic dermatitis of scalp Assessment & Plan: Has had before, resolved with Ketoconazole shampoo. Has recently started back. Refills on Ketoconazole sent to pharmacy. Discussed referral to Derm if symptoms persist or are worsening. Will monitor.   Orders: -     Ketoconazole; Apply topically to scalp two times weekly.  Dispense: 200 mL; Refill: 2  Encounter for general counseling and advice on contraceptive management  Moderate persistent asthma without complication  Screening for cervical cancer  Thyroid disorder screen -     TSH  Lipid screening -     Lipid panel    Return in about 3 months (around 09/26/2022) for Follow up.   [name], NP-C [facility] - [facility]

## 2022-06-27 NOTE — Assessment & Plan Note (Signed)
Has had before, resolved with Ketoconazole shampoo. Has recently started back. Refills on Ketoconazole sent to pharmacy. Discussed referral to Derm if symptoms persist or are worsening. Will monitor.

## 2022-06-28 LAB — COMPREHENSIVE METABOLIC PANEL
ALT: 16 U/L (ref 0–35)
AST: 15 U/L (ref 0–37)
Albumin: 4.1 g/dL (ref 3.5–5.2)
Alkaline Phosphatase: 85 U/L (ref 39–117)
BUN: 6 mg/dL (ref 6–23)
CO2: 27 mEq/L (ref 19–32)
Calcium: 9 mg/dL (ref 8.4–10.5)
Chloride: 105 mEq/L (ref 96–112)
Creatinine, Ser: 0.68 mg/dL (ref 0.40–1.20)
GFR: 114.53 mL/min (ref >60.00)
Glucose, Bld: 92 mg/dL (ref 70–99)
Potassium: 3.8 mEq/L (ref 3.5–5.1)
Sodium: 140 mEq/L (ref 135–145)
Total Bilirubin: 0.3 mg/dL (ref 0.2–1.2)
Total Protein: 6.8 g/dL (ref 6.0–8.3)

## 2022-06-28 LAB — CBC WITH DIFFERENTIAL/PLATELET
Basophils Absolute: 0.1 10*3/uL (ref 0.0–0.1)
Basophils Relative: 0.6 % (ref 0.0–3.0)
Eosinophils Absolute: 0.8 10*3/uL — ABNORMAL HIGH (ref 0.0–0.7)
Eosinophils Relative: 9 % — ABNORMAL HIGH (ref 0.0–5.0)
HCT: 37.2 % (ref 36.0–46.0)
Hemoglobin: 12.5 g/dL (ref 12.0–15.0)
Lymphocytes Relative: 41.3 % (ref 12.0–46.0)
Lymphs Abs: 3.5 10*3/uL (ref 0.7–4.0)
MCHC: 33.5 g/dL (ref 30.0–36.0)
MCV: 83.1 fl (ref 78.0–100.0)
Monocytes Absolute: 0.6 10*3/uL (ref 0.1–1.0)
Monocytes Relative: 7.2 % (ref 3.0–12.0)
Neutro Abs: 3.6 10*3/uL (ref 1.4–7.7)
Neutrophils Relative %: 41.9 % — ABNORMAL LOW (ref 43.0–77.0)
Platelets: 504 10*3/uL — ABNORMAL HIGH (ref 150.0–400.0)
RBC: 4.48 Mil/uL (ref 3.87–5.11)
RDW: 13.4 % (ref 11.5–15.5)
WBC: 8.6 10*3/uL (ref 4.0–10.5)

## 2022-06-28 LAB — TSH: TSH: 1.13 u[IU]/mL (ref 0.35–5.50)

## 2022-06-28 LAB — LIPID PANEL
Cholesterol: 183 mg/dL (ref 0–200)
HDL: 25.7 mg/dL — ABNORMAL LOW (ref >39.00)
LDL Cholesterol: 124 mg/dL — ABNORMAL HIGH (ref 0–99)
NonHDL: 157.69
Total CHOL/HDL Ratio: 7
Triglycerides: 168 mg/dL — ABNORMAL HIGH (ref 0.0–149.0)
VLDL: 33.6 mg/dL (ref 0.0–40.0)

## 2022-06-28 LAB — HEMOGLOBIN A1C: Hgb A1c MFr Bld: 5 % (ref 4.6–6.5)

## 2022-06-28 MED ORDER — FLUTICASONE-SALMETEROL 100-50 MCG/ACT IN AEPB: INHALATION_SPRAY | RESPIRATORY_TRACT | 11 refills | Status: DC

## 2022-06-28 MED ORDER — ALBUTEROL SULFATE HFA 108 (90 BASE) MCG/ACT IN AERS: 1.0000 | INHALATION_SPRAY | Freq: Four times a day (QID) | RESPIRATORY_TRACT | 11 refills | Status: DC | PRN

## 2022-06-28 NOTE — Assessment & Plan Note (Signed)
Chronic. Stable on current medication regimen. Continue. Refills sent. Encouraged to contact if symptoms are worsening or changing. Will monitor.

## 2022-06-28 NOTE — Assessment & Plan Note (Signed)
Discussed birth control options with patient- OCP, IUD, Depo- patient interested in non-hormonal options. She also voiced interest in a tubal ligation. Will refer to Ob-Gyn for further discussion of options and well woman exam, as she is due for a pap.

## 2022-06-28 NOTE — Assessment & Plan Note (Addendum)
Long discussion with patient about healthy diet, low carb and high protein, and increasing exercise. Dietary information provided to patient. Will check A1c today and refer to Medical Weight Management. Will continue to monitor.

## 2022-06-29 ENCOUNTER — Encounter: Payer: Self-pay | Admitting: Nurse Practitioner

## 2022-07-05 ENCOUNTER — Other Ambulatory Visit: Payer: Self-pay | Admitting: Nurse Practitioner

## 2022-07-05 DIAGNOSIS — R7989 Other specified abnormal findings of blood chemistry: Secondary | ICD-10-CM

## 2022-07-12 ENCOUNTER — Telehealth: Payer: Self-pay | Admitting: Nurse Practitioner

## 2022-07-12 NOTE — Telephone Encounter (Signed)
Patient has a lab appointment 07/16/2022, there are no orders in. 

## 2022-07-16 ENCOUNTER — Other Ambulatory Visit (INDEPENDENT_AMBULATORY_CARE_PROVIDER_SITE_OTHER): Payer: 59

## 2022-07-16 DIAGNOSIS — R7989 Other specified abnormal findings of blood chemistry: Secondary | ICD-10-CM

## 2022-07-17 LAB — CBC WITH DIFFERENTIAL/PLATELET
Basophils Absolute: 0 10*3/uL (ref 0.0–0.1)
Basophils Relative: 0.4 % (ref 0.0–3.0)
Eosinophils Absolute: 0.9 10*3/uL — ABNORMAL HIGH (ref 0.0–0.7)
Eosinophils Relative: 9.3 % — ABNORMAL HIGH (ref 0.0–5.0)
HCT: 37.5 % (ref 36.0–46.0)
Hemoglobin: 12.5 g/dL (ref 12.0–15.0)
Lymphocytes Relative: 38.5 % (ref 12.0–46.0)
Lymphs Abs: 3.7 10*3/uL (ref 0.7–4.0)
MCHC: 33.3 g/dL (ref 30.0–36.0)
MCV: 84.2 fl (ref 78.0–100.0)
Monocytes Absolute: 1 10*3/uL (ref 0.1–1.0)
Monocytes Relative: 10 % (ref 3.0–12.0)
Neutro Abs: 4 10*3/uL (ref 1.4–7.7)
Neutrophils Relative %: 41.8 % — ABNORMAL LOW (ref 43.0–77.0)
Platelets: 483 10*3/uL — ABNORMAL HIGH (ref 150.0–400.0)
RBC: 4.45 Mil/uL (ref 3.87–5.11)
RDW: 13.2 % (ref 11.5–15.5)
WBC: 9.6 10*3/uL (ref 4.0–10.5)

## 2022-08-03 ENCOUNTER — Ambulatory Visit (INDEPENDENT_AMBULATORY_CARE_PROVIDER_SITE_OTHER): Payer: 59 | Admitting: Licensed Practical Nurse

## 2022-08-03 ENCOUNTER — Other Ambulatory Visit (HOSPITAL_COMMUNITY)
Admission: RE | Admit: 2022-08-03 | Discharge: 2022-08-03 | Disposition: A | Discharge status: Home / Self Care | Payer: 59 | Source: Ambulatory Visit | Attending: Licensed Practical Nurse | Admitting: Licensed Practical Nurse

## 2022-08-03 VITALS — BP 122/85 | HR 87 | Wt 236.1 lb

## 2022-08-03 DIAGNOSIS — Z01419 Encounter for gynecological examination (general) (routine) without abnormal findings: Secondary | ICD-10-CM

## 2022-08-03 DIAGNOSIS — Z124 Encounter for screening for malignant neoplasm of cervix: Secondary | ICD-10-CM | POA: Insufficient documentation

## 2022-08-03 DIAGNOSIS — Z3009 Encounter for other general counseling and advice on contraception: Secondary | ICD-10-CM

## 2022-08-03 DIAGNOSIS — N898 Other specified noninflammatory disorders of vagina: Secondary | ICD-10-CM | POA: Insufficient documentation

## 2022-08-03 DIAGNOSIS — Z7689 Persons encountering health services in other specified circumstances: Secondary | ICD-10-CM

## 2022-08-03 DIAGNOSIS — F419 Anxiety disorder, unspecified: Secondary | ICD-10-CM

## 2022-08-05 ENCOUNTER — Encounter: Payer: Self-pay | Admitting: Licensed Practical Nurse

## 2022-08-05 MED ORDER — LORAZEPAM 2 MG PO TABS: 2.0000 mg | ORAL_TABLET | Freq: Once | ORAL | 0 refills | Status: AC

## 2022-08-05 NOTE — Progress Notes (Signed)
Gynecology Annual Exam   PCP: [name], NP  Chief Complaint:  Chief Complaint  Patient presents with   Gynecologic Exam    History of Present Illness: Patient is a 32 y.o. Erin [name] presents for annual exam. The patient has no complaints today. She would like to discuss birth control options.  -She has been using Nexplanon for some time, she is on her second rod. She would like to stop hormonal birth control out of concern for family hx of Breast Cancer (Mother diagnosed at age 22) and kidney failure (father). She is sexually active, she does not desire another pregnancy but does not want to have a tubal ligation. She strongly desires a Paragard IUD, admits to feeling anxious about placement.   LMP: Patient's last menstrual period was 08/01/2022 (exact date). Has only had 2 cycles in 4 years since using Nexplanon  When not on hormones cycles are irregular, painful, heavy, last 9 days.   The patient is sexually active with 1 female partner. She currently uses Nexplanon for contraception. She has dyspareunia, feels it is related to "friction", pain is mostly at the beginning of IC, over the last few months the pain has occurred every time, has tried lube, some lubes cause irritation. She has started the medication Xyzal in the last year.   The patient does perform self breast exams.  There is notable family history of breast or ovarian cancer in her family.  The patient wears seatbelts: yes.   The patient has regular exercise: yes.  Tries to be active daily, currently is trying to lose weight has lost 20lbs since December. Has cut out Gluten and dairy. Has nutrition consult coming up. Does not use Tobacco, Alcohol, or illicit drugs   The patient reports current symptoms of depression.  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  HENT: Negative.    Eyes: Negative.   Respiratory:  Positive for wheezing.   Cardiovascular: Negative.   Gastrointestinal: Negative.   Genitourinary:        Thin yellow vaginal discharge x a few days, denies odor   Musculoskeletal: Negative.   Skin: Negative.   Neurological: Negative.   Endo/Heme/Allergies: Negative.   Psychiatric/Behavioral:  Positive for depression.     Past Medical History:  Patient Active Problem List   Diagnosis Date Noted   Morbid obesity with BMI of 40.0-44.9, adult (HCC) 06/27/2022   Seborrheic dermatitis of scalp 06/27/2022   Encounter for general counseling and advice on contraceptive management 06/27/2022   Moderate persistent asthma without complication 06/12/2018   Major depressive disorder 06/12/2018    Past Surgical History:  Past Surgical History:  Procedure Laterality Date   TAB      Gynecologic History:  Patient's last menstrual period was 08/01/2022 (exact date). Contraception: Nexplanon Last Pap: Results were: no results on file   Obstetric History: [id number]  Family History:  Family History  Problem Relation Age of Onset   Cancer Mother    Kidney disease Father    Heart disease Father    Hyperlipidemia Father    Hypertension Father    Miscarriages /  Stillbirths Sister    Cancer Paternal Aunt    Early death Maternal Grandmother    Early death Maternal Grandfather    Cancer Maternal Grandfather    Hypertension Paternal Grandmother    Hypertension Paternal Grandfather    Anesthesia problems Neg Hx    Hypotension Neg Hx    Malignant hyperthermia Neg Hx    Pseudochol deficiency Neg Hx     Social History:  Social History   Socioeconomic History   Marital status: Single    Spouse name: Not on file    Number of children: Not on file   Years of education: Not on file   Highest education level: Not on file  Occupational History   Not on file  Tobacco Use   Smoking status: Never   Smokeless tobacco: Never  Vaping Use   Vaping Use: Never used  Substance and Sexual Activity   Alcohol use: No   Drug use: No   Sexual activity: Yes    Birth control/protection: Implant  Other Topics Concern   Not on file  Social History Narrative   Not on file   Social Determinants of Health   Financial Resource Strain: Not on file  Food Insecurity: Not on file  Transportation Needs: Not on file  Physical Activity: Not on file  Stress: Not on file  Social Connections: Not on file  Intimate Partner Violence: Not on file    Allergies:  Allergies  Allergen Reactions   Marijuana (Cannabis Sativa) Anaphylaxis, Hives and Swelling    Dx in [place] at the age of 66   Naproxen Sodium Other (See Comments)    Difficulty with imbalance;  Difficulty with imbalance;     Medications: Prior to Admission medications   Medication Sig Start Date End Date Taking? Authorizing Provider  albuterol (PROVENTIL) (2.5 MG/3ML) 0.083% nebulizer solution Take 3 mLs (2.5 mg total) by nebulization every 4 (four) hours as needed for wheezing or shortness of breath. 02/11/22  Yes Wallis Bamberg, PA-C  albuterol (VENTOLIN HFA) 108 (90 Base) MCG/ACT inhaler Inhale 1-2 puffs into the lungs every 6 (six) hours as needed for wheezing or shortness of breath. 06/28/22  Yes [name], NP  fluticasone-salmeterol (ADVAIR DISKUS) 100-50 MCG/ACT AEPB INHALE 1 PUFF BY MOUTH INTO THE LUNGS 2 TIMES DAILY. 06/28/22  Yes [name], NP  KETOCONAZOLE, TOPICAL, 1 % SHAM Apply topically to scalp two times weekly. 06/28/22  Yes [name], NP  levocetirizine (XYZAL) 5 MG tablet Take 5 mg by mouth every evening.   Yes [provider]    Physical Exam Vitals: Blood pressure 122/85, pulse 87, weight 236 lb 1.6 oz (107.1 kg), last  menstrual period 08/01/2022.  General: NAD HEENT: normocephalic, anicteric Thyroid: no enlargement, no palpable nodules Pulmonary: No increased work of breathing, CTAB Cardiovascular: RRR, distal pulses 2+ Breast: Breast symmetrical, no tenderness, no palpable nodules or masses, no skin or nipple retraction present, no nipple discharge.  No axillary or supraclavicular lymphadenopathy. Abdomen: NABS, soft, non-tender, non-distended.  Umbilicus without lesions.  No hepatomegaly, splenomegaly or masses palpable. No evidence of hernia  Genitourinary:  External: Normal external female genitalia.  Normal urethral meatus, normal Bartholin's and Skene's glands.    Vagina: Normal vaginal mucosa, no evidence of prolapse.  Good tone   Cervix: Grossly normal in appearance, no bleeding  Uterus: Non-enlarged, mobile, normal contour.  No CMT  Adnexa: ovaries non-enlarged, no adnexal masses  Rectal: deferred  Lymphatic: no evidence of inguinal lymphadenopathy Extremities: no edema, erythema, or  tenderness Neurologic: Grossly intact Psychiatric: mood appropriate, affect full  Assessment: 32 y.o. Erin [name] routine annual exam  Plan: Problem List Items Addressed This Visit   None Visit Diagnoses     Well woman exam with routine gynecological exam    -  Primary   Relevant Orders   Cytology - PAP   Establishing care with new doctor, encounter for       Cervical cancer screening       Relevant Orders   Cytology - PAP   Birth control counseling       Vaginal discharge       Relevant Orders   Cervicovaginal ancillary only       2) STI screening  wasoffered and accepted swabs only   2)  ASCCP guidelines and rational discussed.  Patient opts for every 3 years screening interval  3) Contraception - the patient is currently using  Nexplanon.  She is interested in changing to Paragard, will read more and then call to schedule appoinment   Ativan x1 sent to pharmacy.   4) Routine healthcare  maintenance including cholesterol, diabetes screening discussed managed by PCP  5) No follow-ups on file.  Carie Caddy, CNM  [facility], [facility] 08/05/2022, 2:45 PM

## 2022-08-07 LAB — CERVICOVAGINAL ANCILLARY ONLY
Bacterial Vaginitis (gardnerella): NEGATIVE
Candida Glabrata: NEGATIVE
Candida Vaginitis: POSITIVE — AB
Comment: NEGATIVE
Comment: NEGATIVE
Comment: NEGATIVE
Comment: NEGATIVE
Trichomonas: NEGATIVE

## 2022-08-09 ENCOUNTER — Other Ambulatory Visit: Payer: Self-pay | Admitting: Licensed Practical Nurse

## 2022-08-09 DIAGNOSIS — B3731 Acute candidiasis of vulva and vagina: Secondary | ICD-10-CM

## 2022-08-09 LAB — CYTOLOGY - PAP
Comment: NEGATIVE
Diagnosis: NEGATIVE
High risk HPV: NEGATIVE

## 2022-08-09 MED ORDER — FLUCONAZOLE 150 MG PO TABS: 150.0000 mg | ORAL_TABLET | ORAL | 0 refills | Status: AC

## 2022-08-09 NOTE — Progress Notes (Signed)
Pt reported yellowish discharge. Swab show yeast. Script sent for Diflucan. Mychart message sent. Carie Caddy, CNM   [facility]  08/09/22  6:57 PM

## 2022-08-15 ENCOUNTER — Encounter: Payer: Self-pay | Admitting: Allergy

## 2022-08-15 ENCOUNTER — Telehealth: Payer: Self-pay

## 2022-08-15 ENCOUNTER — Ambulatory Visit (INDEPENDENT_AMBULATORY_CARE_PROVIDER_SITE_OTHER): Payer: 59 | Admitting: Allergy

## 2022-08-15 VITALS — BP 118/62 | HR 90 | Temp 98.6°F | Resp 18 | Ht 63.3 in | Wt 233.6 lb

## 2022-08-15 DIAGNOSIS — J3089 Other allergic rhinitis: Secondary | ICD-10-CM | POA: Diagnosis not present

## 2022-08-15 DIAGNOSIS — T781XXD Other adverse food reactions, not elsewhere classified, subsequent encounter: Secondary | ICD-10-CM

## 2022-08-15 DIAGNOSIS — J4540 Moderate persistent asthma, uncomplicated: Secondary | ICD-10-CM

## 2022-08-15 DIAGNOSIS — J302 Other seasonal allergic rhinitis: Secondary | ICD-10-CM

## 2022-08-15 DIAGNOSIS — L309 Dermatitis, unspecified: Secondary | ICD-10-CM

## 2022-08-15 DIAGNOSIS — H1013 Acute atopic conjunctivitis, bilateral: Secondary | ICD-10-CM

## 2022-08-15 MED ORDER — FLUOCINOLONE ACETONIDE SCALP 0.01 % EX OIL: 1.0000 | TOPICAL_OIL | CUTANEOUS | 0 refills | Status: AC | PRN

## 2022-08-15 NOTE — Progress Notes (Addendum)
.  Right   New Patient Note  RE: Erin [name] MRN: [id number] DOB: [birth year] Date of Office Visit: 08/15/2022   Primary care provider: [name], NP  Chief Complaint: ?food allergy  History of present illness: Erin [name] is a 32 y.o. female presenting today for evaluation of possible food allergy.    She states she has been trying to get her weight under control.  She also states she has seb derm of scalp and under breast that started around 2019 and states last year she did not have issues with it but this year it seems to have come back.  She states it started randomly.  She would have large scaly flakes she could scrap from scalp with prior to washing hair.  The scalp would be inflamed and tender too.  She states she has been noticing this year flakes of scalp again and dryness of the under breast.  She states it seem to come back around the time she started changing her diet.  She started doing gluten-free and dairy-free as much as possible.   She tried to stay away from nightshades.  She states her diet consist of veggies, meat, fruit, rice, and she has tried pastas made from chickpea, buckwheat, lentil.  For her scalp she has been using ketoconazole shampoo and a topical cream after going to a [facility] for treatment.  She reports head and shoulders made her scalp dermatitis worse.  She did use ACV rinse that also did not help on her scalp but did work on the body areas.  She states she washes her hair every 2 weeks or so.  She states she is allergic to pollen, cat, dust mites, cigarrette smoke/smoke in general.   She reports symptoms of nasal congestion, watery eyes, dry cough, throat itchy, sneezing.  She will take xyzal daily and is effective.    She takes Advair 100-50 1 puff twice a day.  She states this paired with the xyzal is a great controller regimen for her.  Stress and high allergen exposure are triggers for her asthma.  She has rare use of albuterol on this  combination of medications.  No history of food allergy history.    Review of systems: Review of Systems  Constitutional: Negative.   HENT: Negative.    Eyes: Negative.   Respiratory: Negative.    Cardiovascular: Negative.   Gastrointestinal: Negative.   Musculoskeletal: Negative.   Skin:  Positive for rash.  Allergic/Immunologic: Negative.   Neurological: Negative.     All other systems negative unless noted above in HPI  Past medical history: Past Medical History:  Diagnosis Date   Asthma    Depression    Miscarriage     Past surgical history: Past Surgical History:  Procedure Laterality Date   TAB      Family history:  Family History  Problem Relation Age of Onset   Cancer Mother    Kidney disease Father    Heart disease Father    Hyperlipidemia Father    Hypertension Father    Miscarriages / Stillbirths Sister    Allergic rhinitis Maternal Uncle    Asthma Maternal Uncle    Cancer Paternal Aunt    Allergic rhinitis Paternal Uncle    Asthma Paternal Uncle    Early death Maternal Grandmother    Early death Maternal Grandfather    Cancer Maternal Grandfather    Hypertension Paternal Grandmother    Hypertension Paternal Grandfather    Anesthesia  problems Neg Hx    Hypotension Neg Hx    Malignant hyperthermia Neg Hx    Pseudochol deficiency Neg Hx    Eczema Neg Hx    Urticaria Neg Hx     Social history: Lives in a home with carpeting with electric heating and central cooling.  Cat and dog in the home.  No concern for water damage, mildew or roaches in the home.  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  Denies a smoking history.   Medication List: Current Outpatient Medications  Medication Sig Dispense Refill   albuterol (PROVENTIL) (2.5 MG/3ML) 0.083% nebulizer solution Take 3 mLs (2.5 mg total) by nebulization every 4 (four) hours as needed for wheezing or shortness of breath. 75 mL 0   albuterol (VENTOLIN HFA) 108 (90 Base) MCG/ACT inhaler Inhale  1-2 puffs into the lungs every 6 (six) hours as needed for wheezing or shortness of breath. 18 g 11   fluticasone-salmeterol (ADVAIR DISKUS) 100-50 MCG/ACT AEPB INHALE 1 PUFF BY MOUTH INTO THE LUNGS 2 TIMES DAILY. 60 each 11   KETOCONAZOLE, TOPICAL, 1 % SHAM Apply topically to scalp two times weekly. 200 mL 2   levocetirizine (XYZAL) 5 MG tablet Take 5 mg by mouth every evening.     No current facility-administered medications for this visit.    Known medication allergies: Allergies  Allergen Reactions   Marijuana (Cannabis Sativa) Anaphylaxis, Hives and Swelling    Dx in [place] at the age of 73   Naproxen Sodium Other (See Comments)    Difficulty with imbalance;  Difficulty with imbalance;      Physical examination: Blood pressure 118/62, pulse 90, temperature 98.6 F (37 C), temperature source Temporal, resp. rate 18, height 5' 3.3" (1.608 m), weight 233 lb 9.6 oz (106 kg), last menstrual period 08/01/2022, SpO2 98 %.  General: Alert, interactive, in no acute distress. HEENT: PERRLA, TMs pearly gray, turbinates non-edematous without discharge, post-pharynx non erythematous. Neck: Supple without lymphadenopathy. Lungs: Clear to auscultation without wheezing, rhonchi or rales. {no increased work of breathing. CV: Normal S1, S2 without murmurs. Abdomen: Nondistended, nontender. Skin: Scalp around crown with white scaly plaques with surrounding erythema.  Under breast bilaterally with raised thickened hyperpigmented plaques . Extremities:  No clubbing, cyanosis or edema. Neuro:   Grossly intact.  Diagnositics/Labs:  Spirometry: FEV1: 2.24 L or 85%, FVC: 2.96 L 95%, ratio consistent with nonobstructive pattern  Allergy testing: Unable to perform skin testing today due to insurance  Assessment and plan: Scalp dermatitis Adverse food reaction Allergic rhinitis with conjunctivitis Moderate persistent asthma   - Return for skin testing visit.  Hold Xyzal and any antihistamines  for 3 days prior to this visit - Will do skin testing select foods and environmental allergens - Recommend for scalp to use Dermasmoothe oil to areas of scalp and leave on overnight then wash next day.  - Continue use of Ketoconazole shampoo for your wash days - Will place dermatology referral Dr Onalee Hua with [facility] dermatology - Continue your Advair 1 puff twice a day - Continue Xyzal 5mg  daily for allergy control - If needed can use Pataday 1 drop each eye daily as needed for itchy/watery eyes - If needed for nasal congestion or drainage can use nasal spray Ryaltris 2 sprays each nostril twice a day  - Lung function testing is normal today!  Breathing control goals:  Full participation in all desired activities (may need albuterol before activity) Albuterol use two time or less a week on average (not  counting use with activity) Cough interfering with sleep two time or less a month Oral steroids no more than once a year No hospitalizations   Follow-up for skin testing Follow-up for routine visit in 3 to 4 months  I appreciate the opportunity to take part in Jovon's care. Please do not hesitate to contact me with questions.  Sincerely,   Margo Aye, MD Allergy/Immunology [facility]

## 2022-08-15 NOTE — Telephone Encounter (Signed)
Dermatology Referral to Dr. Onalee Hua at [facility]  Per Dr. Delorse Lek  Dx: Evaluation for scalp psoriasis

## 2022-08-15 NOTE — Patient Instructions (Signed)
-   Return for skin testing visit.  Hold Xyzal and any antihistamines for 3 days prior to this visit - Will do skin testing select foods and environmental allergens - Recommend for scalp to use Dermasmoothe oil to areas of scalp and leave on overnight then wash next day.  - Continue use of Ketoconazole shampoo for your wash days - Will place dermatology referral Dr Onalee Hua with [facility] dermatology - Continue your Advair 1 puff twice a day - Continue Xyzal 5mg  daily for allergy control - If needed can use Pataday 1 drop each eye daily as needed for itchy/watery eyes - If needed for nasal congestion or drainage can use nasal spray Ryaltris 2 sprays each nostril twice a day  - Lung function testing is normal today!  Breathing control goals:  Full participation in all desired activities (may need albuterol before activity) Albuterol use two time or less a week on average (not counting use with activity) Cough interfering with sleep two time or less a month Oral steroids no more than once a year No hospitalizations   Follow-up for skin testing

## 2022-08-28 NOTE — Telephone Encounter (Signed)
Referral has been placed to Dr. Onalee Hua at [facility].   [facility] [address]  [place], [place] [address] Phone: [phone]  Patient is scheduled to see Dr. Onalee Hua 10-24-2022.   Called and advised patient of appointment and referral. Patient verbalized understanding and expressed her gratitude.

## 2022-09-05 ENCOUNTER — Ambulatory Visit (INDEPENDENT_AMBULATORY_CARE_PROVIDER_SITE_OTHER): Payer: 59 | Admitting: Licensed Practical Nurse

## 2022-09-05 VITALS — BP 120/81 | HR 94 | Wt 233.1 lb

## 2022-09-05 DIAGNOSIS — Z30430 Encounter for insertion of intrauterine contraceptive device: Secondary | ICD-10-CM | POA: Diagnosis not present

## 2022-09-05 DIAGNOSIS — Z3046 Encounter for surveillance of implantable subdermal contraceptive: Secondary | ICD-10-CM

## 2022-09-05 MED ORDER — PARAGARD INTRAUTERINE COPPER IU IUD
1.0000 | INTRAUTERINE_SYSTEM | Freq: Once | INTRAUTERINE | Status: AC
  Administered 2022-09-05: 1 via INTRAUTERINE

## 2022-09-10 ENCOUNTER — Encounter: Payer: Self-pay | Admitting: Licensed Practical Nurse

## 2022-09-10 NOTE — Patient Instructions (Incomplete)
Scalp dermatitis Adverse food reaction Allergic rhinitis with conjunctivitis Moderate persistent asthma - skin testing today to select foods is negative but her histamine did not respond. She would like to come back again for skin testing. Offered to do lab work. She would like to do skin testing - Continue to use Dermasmoothe oil to areas of scalp and leave on overnight then wash next day.  - Continue use of Ketoconazole shampoo for your wash days - Keep appointment with dermatology on 10/24/22 - Continue your Advair 1 puff twice a day - Continue Xyzal 5mg  daily for allergy control - If needed can use Pataday 1 drop each eye daily as needed for itchy/watery eyes - If needed for nasal congestion or drainage can use nasal spray Ryaltris 2 sprays each nostril twice a day   Breathing control goals:  Full participation in all desired activities (may need albuterol before activity) Albuterol use two time or less a week on average (not counting use with activity) Cough interfering with sleep two time or less a month Oral steroids no more than once a year No hospitalizations   Skin testing to select foods at your convenience.  Remember to be off all antihistamines for at least 3 days prior to the appointment Follow-up in 3-4 months or sooner if needed

## 2022-09-10 NOTE — Progress Notes (Signed)
   GYNECOLOGY PROCEDURE NOTE  Implanon removal discussed in detail.  Risks of infection, bleeding, nerve injury all reviewed.  Patient understands risks and desires to proceed.  Verbal consent obtained.  Patient is certain she wants the implanon removed.  All questions answered.  Pt expresses concerns for family history for breast Cancer and kidney disease, she does not want any hormonal birth control.   Procedure: Patient placed in dorsal supine with left arm above head, elbow flexed at 90 degrees, arm resting on examination table.  Implanon identified without problems.  Betadine scrub x3.  2 ml of 1% lidocaine injected under implanon device without problems.  Sterile gloves applied.  Small 0.5cm incision made at distal tip of implanon device with 11 blade scalpel.  Dr Valentino Saxon in to assist. Implanon brought to incision and grasped with a small kelly clamp.  Implanon removed intact without problems.  Pressure applied to incision.  Hemostasis obtained.  Steri-strips applied, followed by bandage and compression dressing.  Patient tolerated procedure well.  No complications.    GYNECOLOGY OFFICE PROCEDURE NOTE  Erin [name] is a 32 y.o. [id number] here for Paragard a IUD insertion. No GYN concerns.  Last pap smear was on 07/2022 and was normal.  The patient is currently using Nexplanon for contraception and her LMP is No LMP recorded. Patient has had an implant..  The indication for her IUD is contraception.  IUD Insertion Procedure Note Patient identified, informed consent performed, consent signed.   Discussed risks of irregular bleeding, cramping, infection, malpositioning, expulsion or uterine perforation of the IUD (1:1000 placements)  which may require further procedure such as laparoscopy.  IUD while effective at preventing pregnancy do not prevent transmission of sexually transmitted diseases and use of barrier methods for this purpose was discussed. Time out was performed.  Urine pregnancy test  negative.  Speculum placed in the vagina.  Cervix visualized.  Cleaned with Betadine x 3.  G.  Uterus sounded to 8 cm. IUD placed per manufacturer's recommendations.  Strings trimmed to 3 cm. Tenaculum was removed, good hemostasis noted.  Patient tolerated procedure well.   Patient was given post-procedure instructions.  She was advised to have backup contraception for one week.  Patient was also asked to check IUD strings periodically and follow up in 6 weeks for IUD check.  Carie Caddy, CNM  [facility]   Assessment: 32 y.o. year old female now s/p uncomplicated implanon removal. And IUD insertion.   Plan: 1.  Patient given post procedure precautions and asked to call for fever, chills, redness or drainage from her incision, bleeding from incision.  She understands she will likely have a small bruise near site of removal and can remove bandage tomorrow and steri-strips in approximately 1 week.  2) Patient was given post-procedure instructions.  She was advised to have backup contraception for one week.  Patient was also asked to check IUD strings periodically and follow up in 6 weeks for IUD check.  Carie Caddy, CNM   [facility]  09/10/22  1:37 PM

## 2022-09-11 ENCOUNTER — Ambulatory Visit (INDEPENDENT_AMBULATORY_CARE_PROVIDER_SITE_OTHER): Payer: 59 | Admitting: Family

## 2022-09-11 ENCOUNTER — Other Ambulatory Visit: Payer: Self-pay

## 2022-09-11 ENCOUNTER — Encounter: Payer: Self-pay | Admitting: Family

## 2022-09-11 VITALS — BP 124/76 | HR 91 | Temp 98.4°F | Resp 18 | Ht 63.78 in | Wt 232.5 lb

## 2022-09-11 DIAGNOSIS — T781XXD Other adverse food reactions, not elsewhere classified, subsequent encounter: Secondary | ICD-10-CM

## 2022-09-11 NOTE — Progress Notes (Signed)
  Date of Service/Encounter:  09/11/22  Allergy testing appointment   Initial visit on Aug 15, 2022, seen for scalp dermatitis, adverse food reaction, allergic rhinitis with conjunctivitis, and moderate persistent asthma.  Please see that note for additional details.  She reports her asthma is under good control and denies any coughing, wheezing, tightness in chest, shortness of breath today.  She also request that we not do skin testing to environmental allergens.  She reports that she has already had this done when she was around 32 years old.  She would just like skin testing to select foods only. She reports that she has been off all antihistamines 3 days prior to this appointment.  Today reports for allergy diagnostic testing:    DIAGNOSTICS:  Skin Testing: Select foods. Inadequate positive control Results discussed with patient/family.   Allergy testing results were read and interpreted by myself, documented by clinical staff.  Patient provided with copy of allergy testing along with avoidance measures when indicated.   Scalp dermatitis Adverse food reaction Allergic rhinitis with conjunctivitis Moderate persistent asthma - skin testing today to select foods is negative but her histamine did not respond. She would like to come back again for skin testing. Offered to do lab work. She would like to do skin testing - Continue to use Dermasmoothe oil to areas of scalp and leave on overnight then wash next day.  - Continue use of Ketoconazole shampoo for your wash days - Keep appointment with dermatology on 10/24/22 - Continue your Advair 1 puff twice a day - Continue Xyzal 5mg  daily for allergy control - If needed can use Pataday 1 drop each eye daily as needed for itchy/watery eyes - If needed for nasal congestion or drainage can use nasal spray Ryaltris 2 sprays each nostril twice a day   Breathing control goals:  Full participation in all desired activities (may need albuterol  before activity) Albuterol use two time or less a week on average (not counting use with activity) Cough interfering with sleep two time or less a month Oral steroids no more than once a year No hospitalizations   Skin testing to select foods at your convenience.  Remember to be off all antihistamines for at least 3 days prior to the appointment Follow-up in 3-4 months or sooner if needed  Nehemiah Settle, FNP [facility]

## 2022-09-24 ENCOUNTER — Ambulatory Visit: Payer: 59 | Admitting: Dietician

## 2022-09-26 ENCOUNTER — Ambulatory Visit (INDEPENDENT_AMBULATORY_CARE_PROVIDER_SITE_OTHER): Payer: 59 | Admitting: Nurse Practitioner

## 2022-09-26 VITALS — BP 122/78 | HR 84 | Temp 98.7°F | Ht 63.78 in | Wt 236.8 lb

## 2022-09-26 DIAGNOSIS — F3341 Major depressive disorder, recurrent, in partial remission: Secondary | ICD-10-CM | POA: Diagnosis not present

## 2022-09-26 DIAGNOSIS — R7989 Other specified abnormal findings of blood chemistry: Secondary | ICD-10-CM | POA: Diagnosis not present

## 2022-09-26 DIAGNOSIS — Z6841 Body Mass Index (BMI) 40.0 and over, adult: Secondary | ICD-10-CM

## 2022-09-26 DIAGNOSIS — J4540 Moderate persistent asthma, uncomplicated: Secondary | ICD-10-CM | POA: Diagnosis not present

## 2022-09-26 DIAGNOSIS — J309 Allergic rhinitis, unspecified: Secondary | ICD-10-CM

## 2022-09-26 NOTE — Assessment & Plan Note (Signed)
Chronic. Improvement in symptoms. Not currently on medications. PHQ- 9 today. Denies SI/HI. She will continue seeing her therapist and working with Psychiatry. She will continue journaling. Encouraged to contact if worsening symptoms, unusual behavior changes or suicidal thoughts occur. Will continue to monitor.

## 2022-09-26 NOTE — Assessment & Plan Note (Addendum)
Chronic. Stable on Advair and Xyzal daily. She uses albuterol PRN. Continue. Follow up with Allergy/Asthma as scheduled.

## 2022-09-26 NOTE — Assessment & Plan Note (Addendum)
She is scheduled to meet with a nutritionist. Encouraged to continue food journaling and working with Allergist to determine food allergies. Counseled on decreasing carb intake and increasing protein. Discussed decreasing snacking. Encouraged to continue working on healthy diet and remaining active. Will continue to monitor.

## 2022-09-26 NOTE — Assessment & Plan Note (Addendum)
Chronic issue. Working with Allergy/Asthma in [place] to complete food allergy testing. Encouraged to follow up as scheduled. She will continue Xyzal daily and nasal spray PRN.

## 2022-09-26 NOTE — Progress Notes (Signed)
[name], NP-C Phone: [phone]  Erin [name] is a 32 y.o. female who presents today for follow up.   Asthma/Allergy- Currently on Advair and Xyzal daily. She only has to use her Albuterol inhaler if she does excess cardio. She is seeing [facility] in [place]. They are working on completing food allergy testing. Denies shortness of breath. Denies wheezing. Denies cough. Denies tightness in chest.   Depression- She has been feeling a lot better recently. Having decreased sad days. She has been journaling. She is seeing a therapist once a month, they are working with Psychiatry on having her re-evaluated for Bipolar disorder. PHQ- 10 today. Denies SI/HI.   Obesity- She is food journaling everything that she consumes. Working with allergist to determine food allergies. She has been limiting dairy and trying to be gluten free. She has been working on increasing her protein. She feels that she has been more active recently. She likes to snack frequently.   Social History   Tobacco Use  Smoking Status Never  Smokeless Tobacco Never    Current Outpatient Medications on File Prior to Visit  Medication Sig Dispense Refill   albuterol (PROVENTIL) (2.5 MG/3ML) 0.083% nebulizer solution Take 3 mLs (2.5 mg total) by nebulization every 4 (four) hours as needed for wheezing or shortness of breath. 75 mL 0   albuterol (VENTOLIN HFA) 108 (90 Base) MCG/ACT inhaler Inhale 1-2 puffs into the lungs every 6 (six) hours as needed for wheezing or shortness of breath. 18 g 11   Fluocinolone Acetonide Scalp (DERMA-SMOOTHE/FS SCALP) 0.01 % OIL Apply 1 Application topically as needed. Leave on overnight then shampoo hair next day 118 mL 0   fluticasone-salmeterol (ADVAIR DISKUS) 100-50 MCG/ACT AEPB INHALE 1 PUFF BY MOUTH INTO THE LUNGS 2 TIMES DAILY. 60 each 11   KETOCONAZOLE, TOPICAL, 1 % SHAM Apply topically to scalp two times weekly. 200 mL 2   levocetirizine (XYZAL) 5 MG tablet Take 5 mg by  mouth every evening.     No current facility-administered medications on file prior to visit.    ROS see history of present illness  Objective  Physical Exam Vitals:   09/26/22 1517  BP: 122/78  Pulse: 84  Temp: 98.7 F (37.1 C)  SpO2: 99%    BP Readings from Last 3 Encounters:  09/26/22 122/78  09/11/22 124/76  09/05/22 120/81   Wt Readings from Last 3 Encounters:  09/26/22 236 lb 12.8 oz (107.4 kg)  09/11/22 232 lb 8 oz (105.5 kg)  09/05/22 233 lb 1.6 oz (105.7 kg)    Physical Exam Constitutional:      General: She is not in acute distress.    Appearance: Normal appearance.  HENT:     Head: Normocephalic.  Cardiovascular:     Rate and Rhythm: Normal rate and regular rhythm.     Heart sounds: Normal heart sounds.  Pulmonary:     Effort: Pulmonary effort is normal.     Breath sounds: Normal breath sounds.  Skin:    General: Skin is warm and dry.  Neurological:     General: No focal deficit present.     Mental Status: She is alert.  Psychiatric:        Mood and Affect: Mood normal.        Behavior: Behavior normal.    Assessment/Plan: Please see individual problem list.  Recurrent major depressive disorder, in partial remission (HCC) Assessment & Plan: Chronic. Improvement in symptoms. Not currently on medications. PHQ- 9  today. Denies SI/HI. She will continue seeing her therapist and working with Psychiatry. She will continue journaling. Encouraged to contact if worsening symptoms, unusual behavior changes or suicidal thoughts occur. Will continue to monitor.    Morbid obesity with BMI of 40.0-44.9, adult [facility]) Assessment & Plan: She is scheduled to meet with a nutritionist. Encouraged to continue food journaling and working with Allergist to determine food allergies. Counseled on decreasing carb intake and increasing protein. Discussed decreasing snacking. Encouraged to continue working on healthy diet and remaining active. Will continue to monitor.     Moderate persistent asthma without complication Assessment & Plan: Chronic. Stable on Advair and Xyzal daily. She uses albuterol PRN. Continue. Follow up with Allergy/Asthma as scheduled.    Allergic rhinitis, unspecified seasonality, unspecified trigger Assessment & Plan: Chronic issue. Working with Allergy/Asthma in [place] to complete food allergy testing. Encouraged to follow up as scheduled. She will continue Xyzal daily and nasal spray PRN.    Elevated platelet count -     CBC with Differential/Platelet   Return in about 6 months (around 03/29/2023) for Follow up.   [name], NP-C [facility] [employer]

## 2022-09-27 ENCOUNTER — Other Ambulatory Visit: Payer: Self-pay | Admitting: Nurse Practitioner

## 2022-09-27 DIAGNOSIS — R7989 Other specified abnormal findings of blood chemistry: Secondary | ICD-10-CM

## 2022-09-27 LAB — CBC WITH DIFFERENTIAL/PLATELET
Basophils Absolute: 0 10*3/uL (ref 0.0–0.1)
Basophils Relative: 0.5 % (ref 0.0–3.0)
Eosinophils Absolute: 0.6 10*3/uL (ref 0.0–0.7)
Eosinophils Relative: 6.1 % — ABNORMAL HIGH (ref 0.0–5.0)
HCT: 36.9 % (ref 36.0–46.0)
Hemoglobin: 12 g/dL (ref 12.0–15.0)
Lymphocytes Relative: 33.4 % (ref 12.0–46.0)
Lymphs Abs: 3.5 10*3/uL (ref 0.7–4.0)
MCHC: 32.5 g/dL (ref 30.0–36.0)
MCV: 84.4 fl (ref 78.0–100.0)
Monocytes Absolute: 0.8 10*3/uL (ref 0.1–1.0)
Monocytes Relative: 8 % (ref 3.0–12.0)
Neutro Abs: 5.4 10*3/uL (ref 1.4–7.7)
Neutrophils Relative %: 52 % (ref 43.0–77.0)
Platelets: 494 10*3/uL — ABNORMAL HIGH (ref 150.0–400.0)
RBC: 4.37 Mil/uL (ref 3.87–5.11)
RDW: 13 % (ref 11.5–15.5)
WBC: 10.4 10*3/uL (ref 4.0–10.5)

## 2022-10-15 ENCOUNTER — Other Ambulatory Visit: Payer: Self-pay

## 2022-10-15 ENCOUNTER — Other Ambulatory Visit: Payer: Self-pay | Admitting: *Deleted

## 2022-10-15 ENCOUNTER — Encounter: Payer: Self-pay | Admitting: Internal Medicine

## 2022-10-15 ENCOUNTER — Inpatient Hospital Stay: Payer: 59

## 2022-10-15 ENCOUNTER — Inpatient Hospital Stay: Payer: 59 | Attending: Internal Medicine | Admitting: Internal Medicine

## 2022-10-15 VITALS — BP 122/78 | HR 74 | Temp 97.7°F | Ht 63.78 in | Wt 234.0 lb

## 2022-10-15 DIAGNOSIS — D75839 Thrombocytosis, unspecified: Secondary | ICD-10-CM | POA: Insufficient documentation

## 2022-10-15 DIAGNOSIS — J45909 Unspecified asthma, uncomplicated: Secondary | ICD-10-CM | POA: Diagnosis not present

## 2022-10-15 DIAGNOSIS — Z8042 Family history of malignant neoplasm of prostate: Secondary | ICD-10-CM | POA: Diagnosis not present

## 2022-10-15 LAB — CBC WITH DIFFERENTIAL/PLATELET
Abs Immature Granulocytes: 0.01 10*3/uL (ref 0.00–0.07)
Basophils Absolute: 0.1 10*3/uL (ref 0.0–0.1)
Basophils Relative: 1 %
Eosinophils Absolute: 0.8 10*3/uL — ABNORMAL HIGH (ref 0.0–0.5)
Eosinophils Relative: 10 %
HCT: 38 % (ref 36.0–46.0)
Hemoglobin: 12.2 g/dL (ref 12.0–15.0)
Immature Granulocytes: 0 %
Lymphocytes Relative: 45 %
Lymphs Abs: 3.7 10*3/uL (ref 0.7–4.0)
MCH: 27.2 pg (ref 26.0–34.0)
MCHC: 32.1 g/dL (ref 30.0–36.0)
MCV: 84.6 fL (ref 80.0–100.0)
Monocytes Absolute: 0.6 10*3/uL (ref 0.1–1.0)
Monocytes Relative: 7 %
Neutro Abs: 3 10*3/uL (ref 1.7–7.7)
Neutrophils Relative %: 37 %
Platelets: 515 10*3/uL — ABNORMAL HIGH (ref 150–400)
RBC: 4.49 MIL/uL (ref 3.87–5.11)
RDW: 12.3 % (ref 11.5–15.5)
WBC: 8.1 10*3/uL (ref 4.0–10.5)
nRBC: 0 % (ref 0.0–0.2)

## 2022-10-15 LAB — FERRITIN: Ferritin: 34 ng/mL (ref 11–307)

## 2022-10-15 LAB — IRON AND TIBC
Iron: 42 ug/dL (ref 28–170)
Saturation Ratios: 16 % (ref 10.4–31.8)
TIBC: 267 ug/dL (ref 250–450)
UIBC: 225 ug/dL

## 2022-10-15 LAB — COMPREHENSIVE METABOLIC PANEL
ALT: 11 U/L (ref 0–44)
AST: 14 U/L — ABNORMAL LOW (ref 15–41)
Albumin: 3.8 g/dL (ref 3.5–5.0)
Alkaline Phosphatase: 72 U/L (ref 38–126)
Anion gap: 8 (ref 5–15)
BUN: <5 mg/dL — ABNORMAL LOW (ref 6–20)
CO2: 25 mmol/L (ref 22–32)
Calcium: 9 mg/dL (ref 8.9–10.3)
Chloride: 103 mmol/L (ref 98–111)
Creatinine, Ser: 0.72 mg/dL (ref 0.44–1.00)
GFR, Estimated: >60 mL/min (ref >60)
Glucose, Bld: 92 mg/dL (ref 70–99)
Potassium: 4 mmol/L (ref 3.5–5.1)
Sodium: 136 mmol/L (ref 135–145)
Total Bilirubin: 0.4 mg/dL (ref 0.3–1.2)
Total Protein: 7.3 g/dL (ref 6.5–8.1)

## 2022-10-15 LAB — TECHNOLOGIST SMEAR REVIEW: Plt Morphology: INCREASED

## 2022-10-15 LAB — LACTATE DEHYDROGENASE: LDH: 129 U/L (ref 98–192)

## 2022-10-15 NOTE — Progress Notes (Signed)
No concerns today 

## 2022-10-15 NOTE — Progress Notes (Signed)
[facility] CONSULT NOTE  Erin [name] [name]: [name], NP as PCP - General (Nurse Practitioner)  CHIEF COMPLAINTS/PURPOSE OF CONSULTATION: Thrombocytosis.   HEMATOLOGY HISTORY  # THROMBOCYTOSIS [platelets- ; Hb; white count]; CT/US-  HISTORY OF PRESENTING ILLNESS: Erin [name] [name]. Alone.   CATHERN CROTTS 32 y.o.  female pleasant Erin [name] [name] of elevated platelets which was incidentally found on blood work.   Erin [name] [name] denies any history of blood clots or strokes. Denies any burning pain or discoloration in Erin [name] fingertips or toes.   Appetite is good . No weight loss or night sweats no recurrent fevers. No cough or shortness of breath or chest pain.   Causes: Medications/steroids-splenectomy/previous blood problems/ smoking. Hx of heavy menstrual cycles- on IUD.   CT:US none   Review of Systems  Constitutional:  Negative for chills, diaphoresis, fever, malaise/fatigue and weight loss.  HENT:  Negative for nosebleeds and sore throat.   Eyes:  Negative for double vision.  Respiratory:  Negative for cough, hemoptysis, sputum production, shortness of breath and wheezing.   Cardiovascular:  Negative for chest pain, palpitations, orthopnea and leg swelling.  Gastrointestinal:  Negative for abdominal pain, blood in stool, constipation, diarrhea, heartburn, melena, nausea and vomiting.  Genitourinary:  Negative for dysuria, frequency and urgency.  Musculoskeletal:  Negative for back pain and joint pain.  Skin: Negative.  Negative for itching and rash.  Neurological:  Negative for dizziness, tingling, focal weakness, weakness and headaches.  Endo/Heme/Allergies:  Does not bruise/bleed easily.  Psychiatric/Behavioral:  Negative for depression. Erin [name] Erin [name] [name] is not nervous/anxious and does not have insomnia.      MEDICAL HISTORY:  Past Medical History:  Diagnosis Date   Asthma    Depression    Miscarriage      SURGICAL HISTORY: Past Surgical History:  Procedure Laterality Date   TAB      SOCIAL HISTORY: Social History   Socioeconomic History   Marital status: Single    Spouse name: Not on file   Number of children: Not on file   Years of education: Not on file   Highest education level: Some college, no degree  Occupational History   Not on file  Tobacco Use   Smoking status: Never   Smokeless tobacco: Never  Vaping Use   Vaping status: Never Used  Substance and Sexual Activity   Alcohol use: No   Drug use: No   Sexual activity: Yes    Birth control/protection: Implant  Other Topics Concern   Not on file  Social History Narrative   Not on file   Social Determinants of Health   Financial Resource Strain: Low Risk  (09/26/2022)   Overall Financial Resource Strain (CARDIA)    Difficulty of Paying Living Expenses: Not very hard  Food Insecurity: No Food Insecurity (09/26/2022)   Hunger Vital Sign    Worried About Running Out of Food in Erin [name] Last Year: Never true    Ran Out of Food in Erin [name] Last Year: Never true  Transportation Needs: No Transportation Needs (09/26/2022)   PRAPARE - Administrator, Civil Service (Medical): No    Lack of Transportation (Non-Medical): No  Physical Activity: Insufficiently Active (09/26/2022)   Exercise Vital Sign    Days of Exercise per Week: 2 days    Minutes of Exercise per Session: 60 min  Stress: Stress Concern Present (09/26/2022)   [employer] of Occupational Health - Occupational Stress Questionnaire    Feeling  of Stress : To some extent  Social Connections: Moderately Isolated (09/26/2022)   Social Connection and Isolation Panel [NHANES]    Frequency of Communication with Friends and Family: More than three times a week    Frequency of Social Gatherings with Friends and Family: Never    Attends Religious Services: Never    Diplomatic Services operational officer: No    Attends Engineer, structural: Not on  file    Marital Status: Living with partner  Intimate Partner Violence: Not on file    FAMILY HISTORY: Family History  Problem Relation Age of Onset   Cancer Mother    Kidney disease Father    Heart disease Father    Hyperlipidemia Father    Hypertension Father    Miscarriages / [place] Sister    Healthy Brother    Allergic rhinitis Maternal Uncle    Asthma Maternal Uncle    Allergic rhinitis Paternal Uncle    Asthma Paternal Uncle    Prostate cancer Paternal Uncle    Early death Maternal Grandmother    Early death Maternal Grandfather    Hypertension Paternal Grandmother    Prostate cancer Paternal Grandmother    Hypertension Paternal Grandfather    Anesthesia problems Neg Hx    Hypotension Neg Hx    Malignant hyperthermia Neg Hx    Pseudochol deficiency Neg Hx    Eczema Neg Hx    Urticaria Neg Hx     ALLERGIES:  is allergic to marijuana (cannabis sativa) and naproxen sodium.  MEDICATIONS:  Current Outpatient Medications  Medication Sig Dispense Refill   albuterol (PROVENTIL) (2.5 MG/3ML) 0.083% nebulizer solution Take 3 mLs (2.5 mg total) by nebulization every 4 (four) hours as needed for wheezing or shortness of breath. 75 mL 0   albuterol (VENTOLIN HFA) 108 (90 Base) MCG/ACT inhaler Inhale 1-2 puffs into Erin [name] lungs every 6 (six) hours as needed for wheezing or shortness of breath. 18 g 11   Fluocinolone Acetonide Scalp (DERMA-SMOOTHE/FS SCALP) 0.01 % OIL Apply 1 Application topically as needed. Leave on overnight then shampoo hair next day 118 mL 0   fluticasone-salmeterol (ADVAIR DISKUS) 100-50 MCG/ACT AEPB INHALE 1 PUFF BY MOUTH INTO Erin [name] LUNGS 2 TIMES DAILY. 60 each 11   KETOCONAZOLE, TOPICAL, 1 % SHAM Apply topically to scalp two times weekly. 200 mL 2   levocetirizine (XYZAL) 5 MG tablet Take 5 mg by mouth every evening.     No current facility-administered medications for this visit.     PHYSICAL EXAMINATION:   Vitals:   10/15/22 1356  BP: 122/78   Pulse: 74  Temp: 97.7 F (36.5 C)  SpO2: 98%   Filed Weights   10/15/22 1356  Weight: 234 lb (106.1 kg)    Physical Exam Vitals and nursing note reviewed.  HENT:     Head: Normocephalic and atraumatic.     Mouth/Throat:     Pharynx: Oropharynx is clear.  Eyes:     Extraocular Movements: Extraocular movements intact.     Pupils: Pupils are equal, round, and reactive to light.  Cardiovascular:     Rate and Rhythm: Normal rate and regular rhythm.  Pulmonary:     Comments: Decreased breath sounds bilaterally.  Abdominal:     Palpations: Abdomen is soft.  Musculoskeletal:        General: Normal range of motion.     Cervical back: Normal range of motion.  Skin:    General: Skin is warm.  Neurological:  General: No focal deficit present.     Mental Status: She is alert and oriented to person, place, and time.  Psychiatric:        Behavior: Behavior normal.        Judgment: Judgment normal.      LABORATORY DATA:  I have reviewed Erin [name] data as listed Lab Results  Component Value Date   WBC 10.4 09/26/2022   HGB 12.0 09/26/2022   HCT 36.9 09/26/2022   MCV 84.4 09/26/2022   PLT 494.0 (H) 09/26/2022   Recent Labs    06/27/22 1603  NA 140  K 3.8  CL 105  CO2 27  GLUCOSE 92  BUN 6  CREATININE 0.68  CALCIUM 9.0  PROT 6.8  ALBUMIN 4.1  AST 15  ALT 16  ALKPHOS 85  BILITOT 0.3     No results found.  ASSESSMENT & PLAN:   Thrombocytosis Thrombocytosis -question essential thrombocytosis versus reactive [clinically less likely]. Erin [name] [name] is asymptomatic.  Long discussion with Erin [name] Erin [name] [name] regarding potential cause of Erin [name] abnormal blood counts-    For now I  recommend checking CBC;CMP; iron studies; ferritin- jak 2 BCR ABL; MPL; CALR mutation on Erin [name] peripheral blood.LDH;  Check peripheral smear. Also discussed regarding bone marrow biopsy with Erin [name] above workup is inconclusive; however I would prefer not to do a bone marrow unless absolutely needed.  # Asthma-  on inhalers  Thank you Ms. Erin [name] Erin [name] [name] for allowing me to participate in Erin [name] care of your pleasant Erin [name] [name]. Please do not hesitate to contact me with questions or concerns in Erin [name] interim.  # Erin [name] [name] follow-up with me in approximately 2-3 weeks to review Erin [name] above results. All questions were answered. Erin [name] Erin [name] [name] knows to call Erin [name] clinic with any problems, questions or concerns.  # DISPOSITION: # labs today- ordered- CBC;CMP jak 2 BCR ABL; MPL; CALR mutation on Erin [name] peripheral blood.LDH; iron studies and ferritin # follow up in 2-3 weeks- MD; no labs- Dr.B       Earna Coder, MD 10/15/2022 2:31 PM

## 2022-10-15 NOTE — Assessment & Plan Note (Addendum)
Thrombocytosis -question essential thrombocytosis versus reactive [clinically less likely]. Patient is asymptomatic.  Long discussion with the patient regarding potential cause of the abnormal blood counts-    For now I  recommend checking CBC;CMP; iron studies; ferritin- jak 2 BCR ABL; MPL; CALR mutation on the peripheral blood.LDH;  Check peripheral smear. Also discussed regarding bone marrow biopsy with the above workup is inconclusive; however I would prefer not to do a bone marrow unless absolutely needed.  # Asthma- on inhalers  Thank you Ms. Elenore Paddy for allowing me to participate in the care of your pleasant patient. Please do not hesitate to contact me with questions or concerns in the interim.  # Patient follow-up with me in approximately 2-3 weeks to review the above results. All questions were answered. The patient knows to call the clinic with any problems, questions or concerns.  # DISPOSITION: # labs today- ordered- CBC;CMP jak 2 BCR ABL; MPL; CALR mutation on the peripheral blood.LDH; iron studies and ferritin # follow up in 2-3 weeks- MD; no labs- Dr.B

## 2022-10-24 ENCOUNTER — Ambulatory Visit: Payer: 59 | Admitting: Dermatology

## 2022-10-30 ENCOUNTER — Inpatient Hospital Stay: Payer: 59 | Admitting: Internal Medicine

## 2022-11-15 ENCOUNTER — Encounter: Payer: Self-pay | Admitting: Internal Medicine

## 2022-11-15 ENCOUNTER — Inpatient Hospital Stay: Payer: 59 | Admitting: Internal Medicine

## 2022-11-20 ENCOUNTER — Encounter: Payer: Self-pay | Admitting: Internal Medicine

## 2022-11-20 ENCOUNTER — Inpatient Hospital Stay: Payer: 59 | Attending: Internal Medicine | Admitting: Internal Medicine

## 2022-11-20 VITALS — BP 127/77 | HR 86 | Temp 97.5°F | Ht 63.78 in | Wt 236.1 lb

## 2022-11-20 DIAGNOSIS — Z8042 Family history of malignant neoplasm of prostate: Secondary | ICD-10-CM | POA: Diagnosis not present

## 2022-11-20 DIAGNOSIS — J45909 Unspecified asthma, uncomplicated: Secondary | ICD-10-CM | POA: Diagnosis not present

## 2022-11-20 DIAGNOSIS — D75839 Thrombocytosis, unspecified: Secondary | ICD-10-CM | POA: Diagnosis not present

## 2022-11-20 DIAGNOSIS — Z803 Family history of malignant neoplasm of breast: Secondary | ICD-10-CM | POA: Insufficient documentation

## 2022-11-20 NOTE — Assessment & Plan Note (Addendum)
Thrombocytosis -UNCLEAR- Essential thrombocytosis versus reactive.  Patient is asymptomatic.  Platelets consistently between 490-500.  # CMP; iron studies [ferritin- 31; iron sat-16%]; ferritin- jak 2 BCR ABL; MPL; CALR mutation- NEGATIVE. Discussed regarding bone marrow biopsy as the above workup is inconclusive; however patient would prefer not to do a bone marrow unless absolutely needed-which I think is reasonable.  # Given the borderline low iron studies [ferritin 31 saturation 16]; history of menstrual cycles-I think is reasonable to start gentle iron [iron biglycinate; 28 mg ] 1 pill a day.  This pill is unlikely to cause stomach upset or cause constipation.   # Asthma- on inhalers  # Family Hx of cancer- mother-breast; father's side cancer. Patient interested in genetic counseling.   # DISPOSITION: #  Referral to genetic counselling re: family history of cancer.  # follow up in 3 months-  MD; labs- cbc/cmp; iron studies; ferritin- Dr.B

## 2022-11-20 NOTE — Addendum Note (Signed)
Addended by: Clydia Llano on: 11/20/2022 04:14 PM   Modules accepted: Orders

## 2022-11-20 NOTE — Progress Notes (Signed)
Bruising: no  Bleeding from gums: no  Go over latest labs.

## 2022-11-20 NOTE — Progress Notes (Signed)
Erin [name] CONSULT NOTE  Patient Care Team: [name], NP as PCP - General (Nurse Practitioner) Earna Coder, MD as Consulting Physician (Oncology)  CHIEF COMPLAINTS/PURPOSE OF CONSULTATION: Thrombocytosis.   HEMATOLOGY HISTORY  # THROMBOCYTOSIS [platelets- ; Hb; white count]; CT/US-  HISTORY OF PRESENTING ILLNESS: Patient ambulating-independently. Alone.   Erin [name] 32 y.o.  female pleasant patient is here for results of the workup ordered for elevated platelets which was incidentally found on blood work.   Patient denies any symptoms.  There is a history of heavy menstrual cycles previous an IUD.  She is concerned about risk of malignancies given her family history.  Review of Systems  Constitutional:  Negative for chills, diaphoresis, fever, malaise/fatigue and weight loss.  HENT:  Negative for nosebleeds and sore throat.   Eyes:  Negative for double vision.  Respiratory:  Negative for cough, hemoptysis, sputum production, shortness of breath and wheezing.   Cardiovascular:  Negative for chest pain, palpitations, orthopnea and leg swelling.  Gastrointestinal:  Negative for abdominal pain, blood in stool, constipation, diarrhea, heartburn, melena, nausea and vomiting.  Genitourinary:  Negative for dysuria, frequency and urgency.  Musculoskeletal:  Negative for back pain and joint pain.  Skin: Negative.  Negative for itching and rash.  Neurological:  Negative for dizziness, tingling, focal weakness, weakness and headaches.  Endo/Heme/Allergies:  Does not bruise/bleed easily.  Psychiatric/Behavioral:  Negative for depression. The patient is not nervous/anxious and does not have insomnia.      MEDICAL HISTORY:  Past Medical History:  Diagnosis Date   Asthma    Depression    Miscarriage     SURGICAL HISTORY: Past Surgical History:  Procedure Laterality Date   TAB      SOCIAL HISTORY: Social History   Socioeconomic History   Marital  status: Single    Spouse name: Not on file   Number of children: Not on file   Years of education: Not on file   Highest education level: Some college, no degree  Occupational History   Not on file  Tobacco Use   Smoking status: Never   Smokeless tobacco: Never  Vaping Use   Vaping status: Never Used  Substance and Sexual Activity   Alcohol use: No   Drug use: No   Sexual activity: Yes    Birth control/protection: Implant  Other Topics Concern   Not on file  Social History Narrative   Not on file   Social Determinants of Health   Financial Resource Strain: Low Risk  (09/26/2022)   Overall Financial Resource Strain (CARDIA)    Difficulty of Paying Living Expenses: Not very hard  Food Insecurity: No Food Insecurity (09/26/2022)   Hunger Vital Sign    Worried About Running Out of Food in the Last Year: Never true    Ran Out of Food in the Last Year: Never true  Transportation Needs: No Transportation Needs (09/26/2022)   PRAPARE - Administrator, Civil Service (Medical): No    Lack of Transportation (Non-Medical): No  Physical Activity: Insufficiently Active (09/26/2022)   Exercise Vital Sign    Days of Exercise per Week: 2 days    Minutes of Exercise per Session: 60 min  Stress: Stress Concern Present (09/26/2022)   [employer] of Occupational Health - Occupational Stress Questionnaire    Feeling of Stress : To some extent  Social Connections: Moderately Isolated (09/26/2022)   Social Connection and Isolation Panel [NHANES]    Frequency of Communication with  Friends and Family: More than three times a week    Frequency of Social Gatherings with Friends and Family: Never    Attends Religious Services: Never    Diplomatic Services operational officer: No    Attends Engineer, structural: Not on file    Marital Status: Living with partner  Intimate Partner Violence: Not on file    FAMILY HISTORY: Family History  Problem Relation Age of Onset    Cancer Mother    Kidney disease Father    Heart disease Father    Hyperlipidemia Father    Hypertension Father    Miscarriages / [place] Sister    Healthy Brother    Allergic rhinitis Maternal Uncle    Asthma Maternal Uncle    Allergic rhinitis Paternal Uncle    Asthma Paternal Uncle    Prostate cancer Paternal Uncle    Early death Maternal Grandmother    Early death Maternal Grandfather    Hypertension Paternal Grandmother    Prostate cancer Paternal Grandmother    Hypertension Paternal Grandfather    Anesthesia problems Neg Hx    Hypotension Neg Hx    Malignant hyperthermia Neg Hx    Pseudochol deficiency Neg Hx    Eczema Neg Hx    Urticaria Neg Hx     ALLERGIES:  is allergic to marijuana (cannabis sativa) and naproxen sodium.  MEDICATIONS:  Current Outpatient Medications  Medication Sig Dispense Refill   albuterol (PROVENTIL) (2.5 MG/3ML) 0.083% nebulizer solution Take 3 mLs (2.5 mg total) by nebulization every 4 (four) hours as needed for wheezing or shortness of breath. 75 mL 0   albuterol (VENTOLIN HFA) 108 (90 Base) MCG/ACT inhaler Inhale 1-2 puffs into the lungs every 6 (six) hours as needed for wheezing or shortness of breath. 18 g 11   Fluocinolone Acetonide Scalp (DERMA-SMOOTHE/FS SCALP) 0.01 % OIL Apply 1 Application topically as needed. Leave on overnight then shampoo hair next day 118 mL 0   fluticasone-salmeterol (ADVAIR DISKUS) 100-50 MCG/ACT AEPB INHALE 1 PUFF BY MOUTH INTO THE LUNGS 2 TIMES DAILY. 60 each 11   KETOCONAZOLE, TOPICAL, 1 % SHAM Apply topically to scalp two times weekly. 200 mL 2   levocetirizine (XYZAL) 5 MG tablet Take 5 mg by mouth every evening.     No current facility-administered medications for this visit.     PHYSICAL EXAMINATION:   Vitals:   11/20/22 1514  BP: 127/77  Pulse: 86  Temp: (!) 97.5 F (36.4 C)  SpO2: 98%   Filed Weights   11/20/22 1514  Weight: 236 lb 1.6 oz (107.1 kg)    Physical Exam Vitals and nursing  note reviewed.  HENT:     Head: Normocephalic and atraumatic.     Mouth/Throat:     Pharynx: Oropharynx is clear.  Eyes:     Extraocular Movements: Extraocular movements intact.     Pupils: Pupils are equal, round, and reactive to light.  Cardiovascular:     Rate and Rhythm: Normal rate and regular rhythm.  Pulmonary:     Comments: Decreased breath sounds bilaterally.  Abdominal:     Palpations: Abdomen is soft.  Musculoskeletal:        General: Normal range of motion.     Cervical back: Normal range of motion.  Skin:    General: Skin is warm.  Neurological:     General: No focal deficit present.     Mental Status: She is alert and oriented to person, place, and time.  Psychiatric:        Behavior: Behavior normal.        Judgment: Judgment normal.      LABORATORY DATA:  I have reviewed the data as listed Lab Results  Component Value Date   WBC 8.1 10/15/2022   HGB 12.2 10/15/2022   HCT 38.0 10/15/2022   MCV 84.6 10/15/2022   PLT 515 (H) 10/15/2022   Recent Labs    06/27/22 1603 10/15/22 1442  NA 140 136  K 3.8 4.0  CL 105 103  CO2 27 25  GLUCOSE 92 92  BUN 6 <5*  CREATININE 0.68 0.72  CALCIUM 9.0 9.0  GFRNONAA  --  >60  PROT 6.8 7.3  ALBUMIN 4.1 3.8  AST 15 14*  ALT 16 11  ALKPHOS 85 72  BILITOT 0.3 0.4     No results found.  ASSESSMENT & PLAN:   Thrombocytosis Thrombocytosis -UNCLEAR- Essential thrombocytosis versus reactive.  Patient is asymptomatic.  Platelets consistently between 490-500.  # CMP; iron studies [ferritin- 31; iron sat-16%]; ferritin- jak 2 BCR ABL; MPL; CALR mutation- NEGATIVE. Discussed regarding bone marrow biopsy as the above workup is inconclusive; however patient would prefer not to do a bone marrow unless absolutely needed-which I think is reasonable.  # Given the borderline low iron studies [ferritin 31 saturation 16]; history of menstrual cycles-I think is reasonable to start gentle iron [iron biglycinate; 28 mg ] 1  pill a day.  This pill is unlikely to cause stomach upset or cause constipation.   # Asthma- on inhalers  # Family Hx of cancer- mother-breast; father's side cancer. Patient interested in genetic counseling.   # DISPOSITION: #  Referral to genetic counselling re: family history of cancer.  # follow up in 3 months-  MD; labs- cbc/cmp; iron studies; ferritin- Dr.B        Earna Coder, MD 11/20/2022 4:06 PM

## 2022-11-20 NOTE — Patient Instructions (Signed)
#   Recommend gentle iron [iron bisglycinate; 28 mg ] 1 pill a day.  This pill is unlikely to cause stomach upset or cause constipation.

## 2022-12-11 ENCOUNTER — Inpatient Hospital Stay: Payer: 59

## 2022-12-11 ENCOUNTER — Inpatient Hospital Stay: Payer: 59 | Admitting: Licensed Clinical Social Worker

## 2022-12-11 NOTE — Progress Notes (Deleted)
REFERRING PROVIDER: Earna Coder, MD [address] [place],  [place] [address]  PRIMARY PROVIDER:  [name], NP  PRIMARY REASON FOR VISIT:  1. Family history of breast cancer   2. Family history of prostate cancer      HISTORY OF PRESENT ILLNESS:   Ms. Degroote, a 32 y.o. female, was seen for a [facility] cancer genetics consultation at the request of Dr. Donneta Romberg due to a family history of cancer.  Ms. Stratman presents to clinic today to discuss the possibility of a hereditary predisposition to cancer, genetic testing, and to further clarify her future cancer risks, as well as potential cancer risks for family members.   CANCER HISTORY:  Ms. Sallie is a 32 y.o. female with no personal history of cancer.    RISK FACTORS:  Menarche was at age ***.  First live birth at age ***.  OCP use for approximately {Numbers 1-12 multi-select:20307} years.  Ovaries intact: {Yes/No-Ex:120004}.  Hysterectomy: {Yes/No-Ex:120004}.  Menopausal status: {Menopause:31378}.  HRT use: {Numbers 1-12 multi-select:20307} years. Colonoscopy: {Yes/No-Ex:120004}; {normal/abnormal/not examined:14677}. Mammogram within the last year: {Yes/No-Ex:120004}. Number of breast biopsies: {Numbers 1-12 multi-select:20307}. Up to date with pelvic exams: {Yes/No-Ex:120004}. Any excessive radiation exposure in the past: {Yes/No-Ex:120004}  Past Medical History:  Diagnosis Date   Asthma    Depression    Miscarriage     Past Surgical History:  Procedure Laterality Date   TAB      FAMILY HISTORY:  We obtained a detailed, 4-generation family history.  Significant diagnoses are listed below: Family History  Problem Relation Age of Onset   Cancer Mother    Kidney disease Father    Heart disease Father    Hyperlipidemia Father    Hypertension Father    Miscarriages / [place] Sister    Healthy Brother    Allergic rhinitis Maternal Uncle    Asthma Maternal Uncle    Allergic rhinitis Paternal Uncle     Asthma Paternal Uncle    Prostate cancer Paternal Uncle    Early death Maternal Grandmother    Early death Maternal Grandfather    Hypertension Paternal Grandmother    Prostate cancer Paternal Grandmother    Hypertension Paternal Grandfather    Anesthesia problems Neg Hx    Hypotension Neg Hx    Malignant hyperthermia Neg Hx    Pseudochol deficiency Neg Hx    Eczema Neg Hx    Urticaria Neg Hx     Ms. Korando is unaware of previous family history of genetic testing for hereditary cancer risks. There is no reported Ashkenazi Jewish ancestry. There is no known consanguinity.  GENETIC COUNSELING ASSESSMENT: Ms. Canal is a 32 y.o. female with a family history of breast and prostate cancer which is somewhat suggestive of a hereditary cancer syndrome and predisposition to cancer. We, therefore, discussed and recommended the following at today's visit.   DISCUSSION: We discussed that approximately 10% of breast cancer is hereditary. Most cases of hereditary breast cancer are associated with BRCA1/BRCA2 genes, although there are other genes associated with hereditary cancer as well. Cancers and risks are gene specific. We discussed that testing is beneficial for several reasons including knowing about cancer risks, identifying potential screening and risk-reduction options that may be appropriate, and to understand if other family members could be at risk for cancer and allow them to undergo genetic testing.   We reviewed the characteristics, features and inheritance patterns of hereditary cancer syndromes. We also discussed genetic testing, including the appropriate family members to  test, the process of testing, insurance coverage and turn-around-time for results. We discussed the implications of a negative, positive and/or variant of uncertain significant result. We recommended Ms. Boyce pursue genetic testing for the [facility] Common Hereditary Cancers+RNA gene panel.   Based on Ms. Bertling's family  history of cancer, she meets medical criteria for genetic testing. Despite that she meets criteria, she may still have an out of pocket cost.   PLAN: After considering the risks, benefits, and limitations, Ms. Mcphail provided informed consent to pursue genetic testing and the blood sample was sent to [facility] for analysis of the Common Hereditary Cancers+RNA panel. Results should be available within approximately 2-3 weeks' time, at which point they will be disclosed by telephone to Ms. Polhill, as will any additional recommendations warranted by these results. Ms. Laviola will receive a summary of her genetic counseling visit and a copy of her results once available. This information will also be available in Epic.   *** Despite our recommendation, Ms. Landaverde did not wish to pursue genetic testing at today's visit. We understand this decision and remain available to coordinate genetic testing at any time in the future. We, therefore, recommend Ms. Troutwine continue to follow the cancer screening guidelines given by her primary healthcare provider.  ***Based on Ms. Gundy's family history, we recommended her *** have genetic counseling and testing. Ms. Curatola will let us know if we can be of any assistance in coordinating genetic counseling and/or testing for this family member.   Ms. Kaake questions were answered to her satisfaction today. Our contact information was provided should additional questions or concerns arise. Thank you for the referral and allowing [place] to share in the care of your patient.   Lacy Duverney, MS, [facility] Genetic Counselor [email or web address] Phone: [phone]  The patient was seen for a total of *** minutes in face-to-face genetic counseling.  Dr. Blake Divine was available for discussion regarding this case.   _______________________________________________________________________ For Office Staff:  Number of people involved in session: *** Was an Intern/  student involved with case: no

## 2022-12-12 ENCOUNTER — Encounter: Payer: Self-pay | Admitting: Dermatology

## 2022-12-12 ENCOUNTER — Ambulatory Visit: Payer: 59 | Admitting: Dermatology

## 2022-12-12 ENCOUNTER — Other Ambulatory Visit: Payer: Self-pay

## 2022-12-12 VITALS — BP 110/73

## 2022-12-12 DIAGNOSIS — L409 Psoriasis, unspecified: Secondary | ICD-10-CM

## 2022-12-12 MED ORDER — TRIAMCINOLONE ACETONIDE 0.1 % EX OINT: 1.0000 | TOPICAL_OINTMENT | Freq: Two times a day (BID) | CUTANEOUS | 2 refills | Status: DC

## 2022-12-12 MED ORDER — OTEZLA 30 MG PO TABS: 30.0000 mg | ORAL_TABLET | Freq: Two times a day (BID) | ORAL | 3 refills | Status: DC

## 2022-12-12 MED ORDER — TACROLIMUS 0.1 % EX OINT: TOPICAL_OINTMENT | Freq: Two times a day (BID) | CUTANEOUS | 0 refills | Status: DC

## 2022-12-12 MED ORDER — CLOBETASOL PROPIONATE 0.05 % EX SOLN: 1.0000 | Freq: Two times a day (BID) | CUTANEOUS | 2 refills | Status: DC

## 2022-12-12 NOTE — Progress Notes (Addendum)
   New Patient Visit   Subjective  Erin [name] is a 32 y.o. female who presents for the following: Rash under breasts and on her scalp that started ~2022. [facility] and she was given Ketoconazole cream and shampoo. The cream helped under her breasts but the shampoo did not help her scalp. It is itchy and flaky and feels raised and inflamed. She washes her hair about once weekly.    The following portions of the chart were reviewed this encounter and updated as appropriate: medications, allergies, medical history  Review of Systems:  No other skin or systemic complaints except as noted in HPI or Assessment and Plan.  Objective  Well appearing patient in no apparent distress; mood and affect are within normal limits.   A focused examination was performed of the following areas: Scalp, trunk  Relevant exam findings are noted in the Assessment and Plan.    Assessment & Plan   PSORIASIS Exam: Thick erythematous plaques with micaceous scale of scalp and atrophic hyperpigmented/erythematous involving inframammary areas. 10% BSA.  Chronic and persistent condition with duration or expected duration over one year. Condition is symptomatic / bothersome to patient. Not to goal.   patient denies joint pain  Psoriasis is a chronic non-curable, but treatable genetic/hereditary disease that may have other systemic features affecting other organ systems such as joints (Psoriatic Arthritis). It is associated with an increased risk of inflammatory bowel disease, heart disease, non-alcoholic fatty liver disease, and depression.  Treatments include light and laser treatments; topical medications; and systemic medications including oral and injectables.  Treatment Plan:  Initiate Otezla starter pack, titrating from 10 mg once a day to 30 mg twice a day, with monitoring for potential side effects such as diarrhea and worsening depression. For scalp treatment, utilize DHS Zinc shampoo  and apply Clobetasol drops as needed. For body lesions, apply Triamcinolone ointment on the neck and under the breast twice daily for two weeks, then switch to Tacrolimus, alternating between Triamcinolone and Tacrolimus until the next follow-up. Encourage adoption of a Mediterranean diet to help reduce inflammation. Schedule follow-up in three months to evaluate treatment effectiveness.  Note Addendum: Starter pack and maintenance pack samples provided to patient in office, Rx will only need to be for maintenance dose of 30mg  BID.    Return in about 2 months (around 02/11/2023).  I, [name], CMA, am acting as scribe for [employer], DO .   Documentation: I have reviewed the above documentation for accuracy and completeness, and I agree with the above.  [name], DO

## 2022-12-12 NOTE — Patient Instructions (Addendum)
Hello Erin [name],  Thank you for visiting my office today. Your dedication to addressing your health concerns is greatly appreciated, and I'm pleased we had the opportunity to discuss your symptoms and treatment options. Here is a summary of the key instructions and recommendations from today's consultation:  - Medications Prescribed:   - Otezla: Begin with a starter pack, increasing from 10 mg once daily to 30 mg twice daily. Be mindful of potential side effects, such as diarrhea.  - Topical Treatments:   - DHS Zinc Shampoo: Use on your scalp. Apply, allow to sit for three minutes, then continue with your regular shampoo routine.   - Clobetasol Drops: Apply directly to the scalp twice a day.   - Triamcinolone Ointment: Apply to the neck and under the breast twice daily for two weeks.   - Tacrolimus Ointment: After two weeks, alternate with Triamcinolone to give the skin a rest.  - Lifestyle Recommendations:   - Consider a Mediterranean diet, rich in antioxidants and low in inflammatory foods, including sugars and certain carbohydrates.   - Limit red meat and sugary drinks; favor green tea and water.   - Opt for non-underwire bras to minimize skin irritation.  - Follow-Up:   - Please return for a follow-up appointment in three months to evaluate the effectiveness of the treatment and discuss any necessary adjustments.   - Keep an eye on any mood changes, especially if you have a history of depression, as a side effect of Otezla.  - Educational Materials:   - You have been provided with information on psoriasis and how diet can influence inflammatory conditions.  Please commence the prescribed treatments as discussed and adhere to the lifestyle recommendations provided. Should you have any questions or encounter any adverse effects, please do not hesitate to reach out to our office.  Warm regards,  Dr. [name] Dermatology

## 2022-12-25 ENCOUNTER — Other Ambulatory Visit: Payer: Self-pay

## 2022-12-25 ENCOUNTER — Encounter (HOSPITAL_COMMUNITY): Payer: Self-pay

## 2022-12-25 ENCOUNTER — Other Ambulatory Visit (HOSPITAL_COMMUNITY): Payer: Self-pay

## 2022-12-25 MED ORDER — APREMILAST 30 MG PO TABS: ORAL_TABLET | ORAL | 3 refills | Status: DC

## 2022-12-26 ENCOUNTER — Other Ambulatory Visit: Payer: Self-pay

## 2022-12-26 ENCOUNTER — Telehealth: Payer: Self-pay | Admitting: Pharmacist

## 2022-12-26 NOTE — Telephone Encounter (Signed)
Called patient to schedule an appointment for the [employer] [facility] [employer] [facility]. I was unable to reach the patient so I left a HIPAA-compliant message requesting that the patient return my call.   Luke Van Ausdall, PharmD, BCACP, CPP Clinical Pharmacist [facility] [phone]  

## 2022-12-28 ENCOUNTER — Other Ambulatory Visit: Payer: Self-pay

## 2022-12-31 ENCOUNTER — Other Ambulatory Visit: Payer: Self-pay

## 2023-01-01 ENCOUNTER — Other Ambulatory Visit: Payer: Self-pay

## 2023-01-02 ENCOUNTER — Other Ambulatory Visit: Payer: Self-pay

## 2023-01-04 ENCOUNTER — Encounter (HOSPITAL_COMMUNITY): Payer: Self-pay

## 2023-01-04 ENCOUNTER — Other Ambulatory Visit (HOSPITAL_COMMUNITY): Payer: Self-pay

## 2023-01-04 ENCOUNTER — Other Ambulatory Visit: Payer: Self-pay

## 2023-01-04 ENCOUNTER — Ambulatory Visit: Payer: 59 | Attending: Nurse Practitioner | Admitting: Pharmacist

## 2023-01-04 ENCOUNTER — Other Ambulatory Visit: Payer: Self-pay | Admitting: Pharmacist

## 2023-01-04 DIAGNOSIS — Z7189 Other specified counseling: Secondary | ICD-10-CM

## 2023-01-04 MED ORDER — APREMILAST 30 MG PO TABS
ORAL_TABLET | ORAL | 3 refills | Status: DC
  Filled 2023-01-04: qty 60, 30d supply, fill #0
  Filled 2023-01-30: qty 60, 30d supply, fill #1
  Filled 2023-02-20: qty 60, 30d supply, fill #2
  Filled 2023-03-27: qty 60, 30d supply, fill #3

## 2023-01-04 NOTE — Progress Notes (Signed)
Please see OV from 01/04/23 for complete documentation.

## 2023-01-04 NOTE — Progress Notes (Signed)
  S: Patient presents for review of their specialty medication therapy.  Patient is currently taking Otezla for psoriasis. Patient is managed by Dr. Onalee Hua for this.   Adherence: confirms. Started samples provided by her specialist.  Efficacy: Already notes some skin clearing and decrease in inflammation.   Dosing:  Active psoriatic arthritis or plaque psoriasis (moderate to severe): Oral: Initial: 10 mg in the morning. Titrate upward by additional 10 mg per day on days 2 to 5 as follows: Day 2: 10 mg twice daily; Day 3: 10 mg in the morning and 20 mg in the evening; Day 4: 20 mg twice daily; Day 5: 20 mg in the morning and 30 mg in the evening. Maintenance dose: 30 mg twice daily starting on day 6  CrCl <30 mL/minute: Initial: 10 mg in the morning on days 1 to 3; titrate using morning doses only (skip evening doses) to 20 mg on days 4 and 5. Maintenance dose: 30 mg once daily in the morning starting on day 6.    Current adverse effects: Headache: none GI upset: some diarrhea initially but this has improved. Nothing contraindicative at this time.  Weight loss: none Neuropsychiatric effects: none  O:     Lab Results  Component Value Date   WBC 8.1 10/15/2022   HGB 12.2 10/15/2022   HCT 38.0 10/15/2022   MCV 84.6 10/15/2022   PLT 515 (H) 10/15/2022      Chemistry      Component Value Date/Time   NA 136 10/15/2022 1442   K 4.0 10/15/2022 1442   CL 103 10/15/2022 1442   CO2 25 10/15/2022 1442   BUN <5 (L) 10/15/2022 1442   CREATININE 0.72 10/15/2022 1442      Component Value Date/Time   CALCIUM 9.0 10/15/2022 1442   ALKPHOS 72 10/15/2022 1442   AST 14 (L) 10/15/2022 1442   ALT 11 10/15/2022 1442   BILITOT 0.4 10/15/2022 1442       A/P: 1. Medication review: patient is currently on Otezla for psoriasis and is tolerating it okay. Reviewed the medication including the following: apremilast inhibits phosphodiesterase 4 (PDE4) specific for cyclic adenosine monophosphate  (cAMP) which results in increased intracellular cAMP levels and regulation of numerous inflammatory mediators (eg, decreased expression of nitric oxide synthase, TNF-alpha, and interleukin [IL]-23, as well as increased IL-10. Patient educated on purpose, proper use and potential adverse effects of Otezla. Possible adverse effects include weight loss, GI upset, headache, and mood changes. Renal function should be routinely monitored. Administer without regard to food. Do not crush, chew, or split tablets. No recommendations for any changes at this time.   Butch Penny, PharmD, P[place]y [place], CPP Clinical Pharmacist [facility] [phone]

## 2023-01-04 NOTE — Progress Notes (Signed)
Specialty Pharmacy Initial Fill Coordination Note  Erin [name] is a 32 y.o. female contacted today regarding refills of specialty medication(s) Apremilast   Patient requested Delivery   Delivery date: 01/08/23   Verified address: [address]   Medication will be filled on 10/21.   Patient is aware of $0 copayment.

## 2023-01-07 ENCOUNTER — Other Ambulatory Visit: Payer: Self-pay

## 2023-01-09 ENCOUNTER — Other Ambulatory Visit: Payer: Self-pay | Admitting: Dermatology

## 2023-01-24 ENCOUNTER — Other Ambulatory Visit (HOSPITAL_COMMUNITY): Payer: Self-pay

## 2023-01-28 ENCOUNTER — Other Ambulatory Visit: Payer: Self-pay

## 2023-01-30 ENCOUNTER — Other Ambulatory Visit: Payer: Self-pay

## 2023-01-30 NOTE — Progress Notes (Signed)
Specialty Pharmacy Refill Coordination Note  DWANNA MAIGNAN is a 32 y.o. female contacted today regarding refills of specialty medication(s) Apremilast   Patient requested Delivery   Delivery date: 02/01/23   Verified address: [address] [place] [address]   Medication will be filled on 01/31/23.

## 2023-01-31 ENCOUNTER — Other Ambulatory Visit: Payer: Self-pay

## 2023-02-04 ENCOUNTER — Encounter: Payer: Self-pay | Admitting: Nurse Practitioner

## 2023-02-04 DIAGNOSIS — J4540 Moderate persistent asthma, uncomplicated: Secondary | ICD-10-CM

## 2023-02-05 MED ORDER — ALBUTEROL SULFATE HFA 108 (90 BASE) MCG/ACT IN AERS: 1.0000 | INHALATION_SPRAY | Freq: Four times a day (QID) | RESPIRATORY_TRACT | 11 refills | Status: DC | PRN

## 2023-02-12 ENCOUNTER — Ambulatory Visit: Payer: 59 | Admitting: Dermatology

## 2023-02-12 VITALS — BP 107/71

## 2023-02-12 DIAGNOSIS — L409 Psoriasis, unspecified: Secondary | ICD-10-CM | POA: Diagnosis not present

## 2023-02-12 NOTE — Progress Notes (Signed)
   Follow-Up Visit   Subjective  Erin [name] is a 32 y.o. female who presents for the following: Psoriasis of scalp and trunk follow up - She is taking Otezla 30 mg twice daily and most all of her psoriasis has cleared up.   The following portions of the chart were reviewed this encounter and updated as appropriate: medications, allergies, medical history  Review of Systems:  No other skin or systemic complaints except as noted in HPI or Assessment and Plan.  Objective  Well appearing patient in no apparent distress; mood and affect are within normal limits.   A focused examination was performed of the following areas: Scalp, face   Relevant exam findings are noted in the Assessment and Plan.    Assessment & Plan   [name] has been on Otezla for approximately 6 weeks and has seen significant improvement in her psoriasis, though some spots have reappeared due to winter weather. She experienced slight diarrhea as a side effect, which resolved on its own. [name] has been using Clobetasol and tacrolimus for remaining spots, and a prescribed topical treatment for her scalp, all of which have been effective. She inquired about her fianc's severe headaches and restricted diet. The plan includes continuing Otezla, alternating topical treatments, and adjusting her skincare regimen.  PSORIASIS Exam:  1% BSA.        Improved   Treatment Plan: Continue Otezla 30 mg 1 po bid.  Recommend DHS Zinc shampoo - apply to scalp and let sit 2-3 minutes then wash with her regular shampoo.   Continue Tacrolimus ointment alternating every 2 weeks with Clobetasol ointment as needed for flares.    2. Skin Care Regimen - Assessment: Patient is using La Roche Triple Moisturizer, Bioderma micellar water, and Versed Vitamin C products. Expressed concern about product compatibility with current skin condition. - Plan: Continue current skin care routine. Use micellar water before face washing. Continue  using glycolic acid as a toner. Consider switching to Toleriane Double Repair moisturizer for daytime use, maintaining Triple Moisture for nighttime. Samples of Double Repair to be provided.   Return in about 4 months (around 06/12/2023) for Psoriasis.  I, Erin [name], CMA, am acting as scribe for [employer], DO .   Documentation: I have reviewed the above documentation for accuracy and completeness, and I agree with the above.  [name], DO

## 2023-02-12 NOTE — Patient Instructions (Addendum)
Hello Erin [name],  Thank you for visiting today. Your dedication to enhancing your health is commendable, and it's encouraging to observe the progress you've made with your treatment plan. Below are the detailed instructions and recommendations from our consultation:  - Medications:   - Otezla: Continue as previously prescribed.   - Clobetasol: Apply for two weeks, followed by Tacrolimus for areas that have not fully cleared.  - Skincare:   - DHS Zinc Shampoo: For dandruff management, apply, leave on for three minutes, rinse thoroughly, and then use a hydrating shampoo.   - Moisturizers: Continue with Triple Moisturizer from [employer] and Bioderma for micellar water. If Triple Moisture feels too heavy, switch to Toleriane Double Repair for daytime use.   - Glycolic Acid: Use as a toner.   - Sunscreen: Employ the recommended spray-on sunscreen with hyaluronic acid to maintain hydration without leaving a white cast.  - Follow-Up:   - We will schedule a follow-up appointment in four months to evaluate your progress. Should you notice any suspicious changes in your skin before then, please arrange for an earlier visit.  - Samples Provided:   - Toleriane Double Repair Moisturizer: A sample has been provided for your use.  Your skin health is on a positive trajectory, and I encourage you to persist with the regimen we've outlined. Should you have any concerns or questions before our next meeting, please feel free to reach out to our office.  Warm regards,  Dr. [name], Dermatologist          Recommended Sunscreen      Important Information  Due to recent changes in healthcare laws, you may see results of your pathology and/or laboratory studies on MyChart before the doctors have had a chance to review them. We understand that in some cases there may be results that are confusing or concerning to you. Please understand that not all results are received at the same time and often the  doctors may need to interpret multiple results in order to provide you with the best plan of care or course of treatment. Therefore, we ask that you please give [place] 2 business days to thoroughly review all your results before contacting the office for clarification. Should we see a critical lab result, you will be contacted sooner.   If You Need Anything After Your Visit  If you have any questions or concerns for your doctor, please call our main line at [phone] If no one answers, please leave a voicemail as directed and we will return your call as soon as possible. Messages left after 4 pm will be answered the following business day.   You may also send [place] a message via MyChart. We typically respond to MyChart messages within 1-2 business days.  For prescription refills, please ask your pharmacy to contact our office. Our fax number is [phone].  If you have an urgent issue when the clinic is closed that cannot wait until the next business day, you can page your doctor at the number below.    Please note that while we do our best to be available for urgent issues outside of office hours, we are not available 24/7.   If you have an urgent issue and are unable to reach [place], you may choose to seek medical care at your doctor's office, retail clinic, urgent care center, or emergency room.  If you have a medical emergency, please immediately call 911 or go to the emergency department. In the event of  inclement weather, please call our main line at [phone] for an update on the status of any delays or closures.  Dermatology Medication Tips: Please keep the boxes that topical medications come in in order to help keep track of the instructions about where and how to use these. Pharmacies typically print the medication instructions only on the boxes and not directly on the medication tubes.   If your medication is too expensive, please contact our office at [phone] or send [place] a message  through MyChart.   We are unable to tell what your co-pay for medications will be in advance as this is different depending on your insurance coverage. However, we may be able to find a substitute medication at lower cost or fill out paperwork to get insurance to cover a needed medication.   If a prior authorization is required to get your medication covered by your insurance company, please allow [place] 1-2 business days to complete this process.  Drug prices often vary depending on where the prescription is filled and some pharmacies may offer cheaper prices.  The website www.goodrx.com contains coupons for medications through different pharmacies. The prices here do not account for what the cost may be with help from insurance (it may be cheaper with your insurance), but the website can give you the price if you did not use any insurance.  - You can print the associated coupon and take it with your prescription to the pharmacy.  - You may also stop by our office during regular business hours and pick up a GoodRx coupon card.  - If you need your prescription sent electronically to a different pharmacy, notify our office through [facility] or by phone at [phone]

## 2023-02-16 ENCOUNTER — Encounter: Payer: Self-pay | Admitting: Dermatology

## 2023-02-19 ENCOUNTER — Inpatient Hospital Stay: Payer: 59

## 2023-02-19 ENCOUNTER — Other Ambulatory Visit: Payer: Self-pay

## 2023-02-19 ENCOUNTER — Other Ambulatory Visit (HOSPITAL_COMMUNITY): Payer: Self-pay

## 2023-02-19 ENCOUNTER — Inpatient Hospital Stay: Payer: 59 | Admitting: Internal Medicine

## 2023-02-20 ENCOUNTER — Other Ambulatory Visit: Payer: Self-pay

## 2023-02-20 NOTE — Progress Notes (Signed)
Specialty Pharmacy Refill Coordination Note  Erin [name] is a 32 y.o. female contacted today regarding refills of specialty medication(s) Apremilast   Patient requested Delivery   Delivery date: 02/26/23   Verified address: [address] [place] [address]   Medication will be filled on 02/25/23.

## 2023-02-25 ENCOUNTER — Other Ambulatory Visit: Payer: Self-pay

## 2023-03-05 ENCOUNTER — Encounter: Payer: Self-pay | Admitting: Internal Medicine

## 2023-03-05 ENCOUNTER — Inpatient Hospital Stay (HOSPITAL_BASED_OUTPATIENT_CLINIC_OR_DEPARTMENT_OTHER): Payer: 59 | Admitting: Internal Medicine

## 2023-03-05 ENCOUNTER — Inpatient Hospital Stay: Payer: 59 | Attending: Internal Medicine

## 2023-03-05 VITALS — BP 126/75 | HR 94 | Temp 97.7°F | Ht 63.78 in | Wt 231.6 lb

## 2023-03-05 DIAGNOSIS — Z803 Family history of malignant neoplasm of breast: Secondary | ICD-10-CM | POA: Diagnosis not present

## 2023-03-05 DIAGNOSIS — D75839 Thrombocytosis, unspecified: Secondary | ICD-10-CM | POA: Diagnosis not present

## 2023-03-05 DIAGNOSIS — Z8042 Family history of malignant neoplasm of prostate: Secondary | ICD-10-CM | POA: Insufficient documentation

## 2023-03-05 DIAGNOSIS — J45909 Unspecified asthma, uncomplicated: Secondary | ICD-10-CM | POA: Diagnosis not present

## 2023-03-05 LAB — CMP (CANCER CENTER ONLY)
ALT: 15 U/L (ref 0–44)
AST: 21 U/L (ref 15–41)
Albumin: 3.7 g/dL (ref 3.5–5.0)
Alkaline Phosphatase: 67 U/L (ref 38–126)
Anion gap: 9 (ref 5–15)
BUN: 9 mg/dL (ref 6–20)
CO2: 24 mmol/L (ref 22–32)
Calcium: 8.9 mg/dL (ref 8.9–10.3)
Chloride: 105 mmol/L (ref 98–111)
Creatinine: 0.78 mg/dL (ref 0.44–1.00)
GFR, Estimated: >60 mL/min (ref >60)
Glucose, Bld: 112 mg/dL — ABNORMAL HIGH (ref 70–99)
Potassium: 3.5 mmol/L (ref 3.5–5.1)
Sodium: 138 mmol/L (ref 135–145)
Total Bilirubin: 1 mg/dL (ref <1.2)
Total Protein: 7.2 g/dL (ref 6.5–8.1)

## 2023-03-05 LAB — CBC WITH DIFFERENTIAL (CANCER CENTER ONLY)
Abs Immature Granulocytes: 0.02 10*3/uL (ref 0.00–0.07)
Basophils Absolute: 0.1 10*3/uL (ref 0.0–0.1)
Basophils Relative: 1 %
Eosinophils Absolute: 0.5 10*3/uL (ref 0.0–0.5)
Eosinophils Relative: 7 %
HCT: 37.1 % (ref 36.0–46.0)
Hemoglobin: 12 g/dL (ref 12.0–15.0)
Immature Granulocytes: 0 %
Lymphocytes Relative: 37 %
Lymphs Abs: 2.7 10*3/uL (ref 0.7–4.0)
MCH: 27.2 pg (ref 26.0–34.0)
MCHC: 32.3 g/dL (ref 30.0–36.0)
MCV: 84.1 fL (ref 80.0–100.0)
Monocytes Absolute: 0.5 10*3/uL (ref 0.1–1.0)
Monocytes Relative: 7 %
Neutro Abs: 3.6 10*3/uL (ref 1.7–7.7)
Neutrophils Relative %: 48 %
Platelet Count: 432 10*3/uL — ABNORMAL HIGH (ref 150–400)
RBC: 4.41 MIL/uL (ref 3.87–5.11)
RDW: 12.9 % (ref 11.5–15.5)
WBC Count: 7.4 10*3/uL (ref 4.0–10.5)
nRBC: 0 % (ref 0.0–0.2)

## 2023-03-05 LAB — IRON AND TIBC
Iron: 57 ug/dL (ref 28–170)
Saturation Ratios: 19 % (ref 10.4–31.8)
TIBC: 300 ug/dL (ref 250–450)
UIBC: 243 ug/dL

## 2023-03-05 LAB — FERRITIN: Ferritin: 41 ng/mL (ref 11–307)

## 2023-03-05 NOTE — Progress Notes (Signed)
[facility] CONSULT NOTE  Patient Care Team: [name], NP as PCP - General (Nurse Practitioner) Earna Coder, MD as Consulting Physician (Oncology)  CHIEF COMPLAINTS/PURPOSE OF CONSULTATION: Thrombocytosis.   HEMATOLOGY HISTORY  # THROMBOCYTOSIS [platelets/ Hb; white count]; CT/US-  HISTORY OF PRESENTING ILLNESS: Patient ambulating-independently. Alone.   Erin [name] 32 y.o.  female pleasant patient is here for results of the workup ordered for elevated platelets- Possibly related to low iron is here for a follow up.   Patient is currently on oral iron.  Tolerating well.  Menstrual cycles are not very heavy.  Not tingling or numbness or burning pain in the extremities.  Review of Systems  Constitutional:  Negative for chills, diaphoresis, fever, malaise/fatigue and weight loss.  HENT:  Negative for nosebleeds and sore throat.   Eyes:  Negative for double vision.  Respiratory:  Negative for cough, hemoptysis, sputum production, shortness of breath and wheezing.   Cardiovascular:  Negative for chest pain, palpitations, orthopnea and leg swelling.  Gastrointestinal:  Negative for abdominal pain, blood in stool, constipation, diarrhea, heartburn, melena, nausea and vomiting.  Genitourinary:  Negative for dysuria, frequency and urgency.  Musculoskeletal:  Negative for back pain and joint pain.  Skin: Negative.  Negative for itching and rash.  Neurological:  Negative for dizziness, tingling, focal weakness, weakness and headaches.  Endo/Heme/Allergies:  Does not bruise/bleed easily.  Psychiatric/Behavioral:  Negative for depression. The patient is not nervous/anxious and does not have insomnia.      MEDICAL HISTORY:  Past Medical History:  Diagnosis Date   Asthma    Depression    Miscarriage     SURGICAL HISTORY: Past Surgical History:  Procedure Laterality Date   TAB      SOCIAL HISTORY: Social History   Socioeconomic History   Marital  status: Single    Spouse name: Not on file   Number of children: Not on file   Years of education: Not on file   Highest education level: Some college, no degree  Occupational History   Not on file  Tobacco Use   Smoking status: Never   Smokeless tobacco: Never  Vaping Use   Vaping status: Never Used  Substance and Sexual Activity   Alcohol use: No   Drug use: No   Sexual activity: Yes    Birth control/protection: Implant  Other Topics Concern   Not on file  Social History Narrative   Not on file   Social Drivers of Health   Financial Resource Strain: Low Risk  (09/26/2022)   Overall Financial Resource Strain (CARDIA)    Difficulty of Paying Living Expenses: Not very hard  Food Insecurity: No Food Insecurity (09/26/2022)   Hunger Vital Sign    Worried About Running Out of Food in the Last Year: Never true    Ran Out of Food in the Last Year: Never true  Transportation Needs: No Transportation Needs (09/26/2022)   PRAPARE - Administrator, Civil Service (Medical): No    Lack of Transportation (Non-Medical): No  Physical Activity: Insufficiently Active (09/26/2022)   Exercise Vital Sign    Days of Exercise per Week: 2 days    Minutes of Exercise per Session: 60 min  Stress: Stress Concern Present (09/26/2022)   [employer] of Occupational Health - Occupational Stress Questionnaire    Feeling of Stress : To some extent  Social Connections: Moderately Isolated (09/26/2022)   Social Connection and Isolation Panel [NHANES]    Frequency of  Communication with Friends and Family: More than three times a week    Frequency of Social Gatherings with Friends and Family: Never    Attends Religious Services: Never    Diplomatic Services operational officer: No    Attends Engineer, structural: Not on file    Marital Status: Living with partner  Intimate Partner Violence: Not on file    FAMILY HISTORY: Family History  Problem Relation Age of Onset   Cancer  Mother    Kidney disease Father    Heart disease Father    Hyperlipidemia Father    Hypertension Father    Miscarriages / [place] Sister    Healthy Brother    Allergic rhinitis Maternal Uncle    Asthma Maternal Uncle    Allergic rhinitis Paternal Uncle    Asthma Paternal Uncle    Prostate cancer Paternal Uncle    Early death Maternal Grandmother    Early death Maternal Grandfather    Hypertension Paternal Grandmother    Prostate cancer Paternal Grandmother    Hypertension Paternal Grandfather    Anesthesia problems Neg Hx    Hypotension Neg Hx    Malignant hyperthermia Neg Hx    Pseudochol deficiency Neg Hx    Eczema Neg Hx    Urticaria Neg Hx     ALLERGIES:  is allergic to marijuana (cannabis sativa) and naproxen sodium.  MEDICATIONS:  Current Outpatient Medications  Medication Sig Dispense Refill   albuterol (PROVENTIL) (2.5 MG/3ML) 0.083% nebulizer solution Take 3 mLs (2.5 mg total) by nebulization every 4 (four) hours as needed for wheezing or shortness of breath. 75 mL 0   albuterol (VENTOLIN HFA) 108 (90 Base) MCG/ACT inhaler Inhale 1-2 puffs into the lungs every 6 (six) hours as needed for wheezing or shortness of breath. 18 g 11   Apremilast 30 MG TABS Take one tablet twice daily as directed 60 tablet 3   clobetasol (TEMOVATE) 0.05 % external solution Apply 1 Application topically 2 (two) times daily. To scalp 50 mL 2   Fe Bisgly-Vit C-Vit B12-FA (GENTLE IRON PO) Take 1 tablet by mouth daily.     Fluocinolone Acetonide Scalp (DERMA-SMOOTHE/FS SCALP) 0.01 % OIL Apply 1 Application topically as needed. Leave on overnight then shampoo hair next day 118 mL 0   fluticasone-salmeterol (ADVAIR DISKUS) 100-50 MCG/ACT AEPB INHALE 1 PUFF BY MOUTH INTO THE LUNGS 2 TIMES DAILY. 60 each 11   KETOCONAZOLE, TOPICAL, 1 % SHAM Apply topically to scalp two times weekly. 200 mL 2   levocetirizine (XYZAL) 5 MG tablet Take 5 mg by mouth every evening.     tacrolimus (PROTOPIC) 0.1 %  ointment Apply topically 2 (two) times daily. Apply to affected areas of neck and under breasts twice daily x 2 weeks. After 2 weeks, apply Triamcinolone ointment twice daily to affected areas x 2 weeks. Continue to alternate medications every 2 weeks. 100 g 0   triamcinolone ointment (KENALOG) 0.1 % Apply 1 Application topically 2 (two) times daily. Apply to affected areas of neck and under breasts twice daily x 2 weeks. After 2 weeks, apply Tacrolimus ointment twice daily to affected areas x 2 weeks. Continue to alternate medications every 2 weeks. 80 g 2   No current facility-administered medications for this visit.     PHYSICAL EXAMINATION:   Vitals:   03/05/23 1517  BP: 126/75  Pulse: 94  Temp: 97.7 F (36.5 C)  SpO2: 97%    Filed Weights   03/05/23 1517  Weight: 231 lb 9.6 oz (105.1 kg)     Physical Exam Vitals and nursing note reviewed.  HENT:     Head: Normocephalic and atraumatic.     Mouth/Throat:     Pharynx: Oropharynx is clear.  Eyes:     Extraocular Movements: Extraocular movements intact.     Pupils: Pupils are equal, round, and reactive to light.  Cardiovascular:     Rate and Rhythm: Normal rate and regular rhythm.  Pulmonary:     Comments: Decreased breath sounds bilaterally.  Abdominal:     Palpations: Abdomen is soft.  Musculoskeletal:        General: Normal range of motion.     Cervical back: Normal range of motion.  Skin:    General: Skin is warm.  Neurological:     General: No focal deficit present.     Mental Status: She is alert and oriented to person, place, and time.  Psychiatric:        Behavior: Behavior normal.        Judgment: Judgment normal.      LABORATORY DATA:  I have reviewed the data as listed Lab Results  Component Value Date   WBC 7.4 03/05/2023   HGB 12.0 03/05/2023   HCT 37.1 03/05/2023   MCV 84.1 03/05/2023   PLT 432 (H) 03/05/2023   Recent Labs    06/27/22 1603 10/15/22 1442  NA 140 136  K 3.8 4.0  CL 105  103  CO2 27 25  GLUCOSE 92 92  BUN 6 <5*  CREATININE 0.68 0.72  CALCIUM 9.0 9.0  GFRNONAA  --  >60  PROT 6.8 7.3  ALBUMIN 4.1 3.8  AST 15 14*  ALT 16 11  ALKPHOS 85 72  BILITOT 0.3 0.4     No results found.  ASSESSMENT & PLAN:   Thrombocytosis Thrombocytosis -UNCLEAR- Essential thrombocytosis versus reactive sec ot Iron deficiency.  Patient is asymptomatic.  Platelets consistently between 490-500. ; jak 2 BCR ABL; MPL; CALR mutation- NEGATIVE. Discussed regarding bone marrow biopsy, however this would not change the management especially given her age even this is ET.  Patient is in agreement.  Will continue monitor for now.  # SEP 2024- iron studies [ferritin- 31; iron sat-16%]-today platelets are 430; improved.  Iron studies pending.  Continue oral gentle iron.   # Asthma- on inhalers  # Family Hx of cancer- mother-breast; father's side cancer-patient will call back to make an appointment later.  # DISPOSITION: # follow up in 6 months-  MD; labs- cbc/cmp; iron studies; ferritin- Dr.B         Earna Coder, MD 03/05/2023 3:41 PM

## 2023-03-05 NOTE — Progress Notes (Signed)
Bruising: no Bleeding from gums: no

## 2023-03-05 NOTE — Assessment & Plan Note (Addendum)
Thrombocytosis -UNCLEAR- Essential thrombocytosis versus reactive sec ot Iron deficiency.  Patient is asymptomatic.  Platelets consistently between 490-500. ; jak 2 BCR ABL; MPL; CALR mutation- NEGATIVE. Discussed regarding bone marrow biopsy, however this would not change the management especially given her age even this is ET.  Patient is in agreement.  Will continue monitor for now.  # SEP 2024- iron studies [ferritin- 31; iron sat-16%]-today platelets are 430; improved.  Iron studies pending.  Continue oral gentle iron.   # Asthma- on inhalers  # Family Hx of cancer- mother-breast; father's side cancer-patient will call back to make an appointment later.  # DISPOSITION: # follow up in 6 months-  MD; labs- cbc/cmp; iron studies; ferritin- Dr.B

## 2023-03-14 ENCOUNTER — Other Ambulatory Visit (HOSPITAL_COMMUNITY): Payer: Self-pay

## 2023-03-19 ENCOUNTER — Other Ambulatory Visit: Payer: Self-pay

## 2023-03-21 ENCOUNTER — Encounter (HOSPITAL_BASED_OUTPATIENT_CLINIC_OR_DEPARTMENT_OTHER): Payer: Self-pay | Admitting: Urology

## 2023-03-21 ENCOUNTER — Other Ambulatory Visit: Payer: Self-pay

## 2023-03-21 DIAGNOSIS — R110 Nausea: Secondary | ICD-10-CM | POA: Insufficient documentation

## 2023-03-21 DIAGNOSIS — R519 Headache, unspecified: Secondary | ICD-10-CM | POA: Diagnosis not present

## 2023-03-21 DIAGNOSIS — R42 Dizziness and giddiness: Secondary | ICD-10-CM | POA: Insufficient documentation

## 2023-03-21 NOTE — ED Triage Notes (Signed)
 Pt states severe headache that started 5 days ago  States nausea, dizziness, and blurred vision  Not taking anything for pain states "it doesn't help"  Sensitive to light in left eye  No h/o migraines

## 2023-03-22 ENCOUNTER — Emergency Department (HOSPITAL_BASED_OUTPATIENT_CLINIC_OR_DEPARTMENT_OTHER): Payer: 59

## 2023-03-22 ENCOUNTER — Emergency Department (HOSPITAL_BASED_OUTPATIENT_CLINIC_OR_DEPARTMENT_OTHER)
Admission: EM | Admit: 2023-03-22 | Discharge: 2023-03-22 | Disposition: A | Discharge status: Home / Self Care | Payer: 59 | Attending: Emergency Medicine | Admitting: Emergency Medicine

## 2023-03-22 DIAGNOSIS — R519 Headache, unspecified: Secondary | ICD-10-CM | POA: Diagnosis not present

## 2023-03-22 LAB — CBC WITH DIFFERENTIAL/PLATELET
Abs Immature Granulocytes: 0.03 10*3/uL (ref 0.00–0.07)
Basophils Absolute: 0.1 10*3/uL (ref 0.0–0.1)
Basophils Relative: 1 %
Eosinophils Absolute: 0.8 10*3/uL — ABNORMAL HIGH (ref 0.0–0.5)
Eosinophils Relative: 7 %
HCT: 35.4 % — ABNORMAL LOW (ref 36.0–46.0)
Hemoglobin: 11.6 g/dL — ABNORMAL LOW (ref 12.0–15.0)
Immature Granulocytes: 0 %
Lymphocytes Relative: 42 %
Lymphs Abs: 4.5 10*3/uL — ABNORMAL HIGH (ref 0.7–4.0)
MCH: 27.2 pg (ref 26.0–34.0)
MCHC: 32.8 g/dL (ref 30.0–36.0)
MCV: 82.9 fL (ref 80.0–100.0)
Monocytes Absolute: 0.7 10*3/uL (ref 0.1–1.0)
Monocytes Relative: 7 %
Neutro Abs: 4.6 10*3/uL (ref 1.7–7.7)
Neutrophils Relative %: 43 %
Platelets: 457 10*3/uL — ABNORMAL HIGH (ref 150–400)
RBC: 4.27 MIL/uL (ref 3.87–5.11)
RDW: 12.7 % (ref 11.5–15.5)
WBC: 10.7 10*3/uL — ABNORMAL HIGH (ref 4.0–10.5)
nRBC: 0 % (ref 0.0–0.2)

## 2023-03-22 LAB — HCG, SERUM, QUALITATIVE: Preg, Serum: NEGATIVE

## 2023-03-22 LAB — SEDIMENTATION RATE: Sed Rate: 36 mm/h — ABNORMAL HIGH (ref 0–22)

## 2023-03-22 LAB — BASIC METABOLIC PANEL
Anion gap: 4 — ABNORMAL LOW (ref 5–15)
BUN: 13 mg/dL (ref 6–20)
CO2: 27 mmol/L (ref 22–32)
Calcium: 8.6 mg/dL — ABNORMAL LOW (ref 8.9–10.3)
Chloride: 103 mmol/L (ref 98–111)
Creatinine, Ser: 0.73 mg/dL (ref 0.44–1.00)
GFR, Estimated: >60 mL/min (ref >60)
Glucose, Bld: 118 mg/dL — ABNORMAL HIGH (ref 70–99)
Potassium: 3.2 mmol/L — ABNORMAL LOW (ref 3.5–5.1)
Sodium: 134 mmol/L — ABNORMAL LOW (ref 135–145)

## 2023-03-22 MED ORDER — TETRACAINE HCL 0.5 % OP SOLN
2.0000 [drp] | Freq: Once | OPHTHALMIC | Status: AC
  Administered 2023-03-22: 2 [drp] via OPHTHALMIC
  Filled 2023-03-22: qty 4

## 2023-03-22 MED ORDER — METOCLOPRAMIDE HCL 5 MG/ML IJ SOLN
10.0000 mg | Freq: Once | INTRAMUSCULAR | Status: AC
  Administered 2023-03-22: 10 mg via INTRAVENOUS
  Filled 2023-03-22: qty 2

## 2023-03-22 MED ORDER — IOHEXOL 350 MG/ML SOLN
75.0000 mL | Freq: Once | INTRAVENOUS | Status: AC | PRN
  Administered 2023-03-22: 75 mL via INTRAVENOUS

## 2023-03-22 MED ORDER — FLUORESCEIN SODIUM 1 MG OP STRP
1.0000 | ORAL_STRIP | Freq: Once | OPHTHALMIC | Status: AC
  Administered 2023-03-22: 1 via OPHTHALMIC
  Filled 2023-03-22: qty 1

## 2023-03-22 MED ORDER — KETOROLAC TROMETHAMINE 30 MG/ML IJ SOLN
15.0000 mg | Freq: Once | INTRAMUSCULAR | Status: AC
  Administered 2023-03-22: 15 mg via INTRAVENOUS
  Filled 2023-03-22: qty 1

## 2023-03-22 MED ORDER — DIPHENHYDRAMINE HCL 50 MG/ML IJ SOLN
25.0000 mg | Freq: Once | INTRAMUSCULAR | Status: AC
  Administered 2023-03-22: 25 mg via INTRAVENOUS
  Filled 2023-03-22: qty 1

## 2023-03-22 NOTE — ED Provider Notes (Signed)
 [facility] Provider Note   CSN: [id number] Arrival date & time: 03/21/23  2229     History  Chief Complaint  Patient presents with   Headache    Erin [name] is a 33 y.o. female.  Patient with a history of asthma presents with a left-sided headache that has been waxing and waning for the past 5 days but constant over the past 2 days.  Pain is the left side of her head associated with nausea, dizziness and blurred vision.  At home she been taking ibuprofen and Tylenol  without relief.  Reports she was at the movies earlier and the headache became worse associate with nausea, dizziness and blurry vision to her left eye.  She has photophobia and phonophobia on the left side only.  No history of migraine headaches.  Patient reports after the movie was over she went to stand up and felt dizzy and lightheaded.  This is associated with nausea, room spinning dizziness, lightheadedness, blurry vision to both eyes.  Blurry vision lasted for several minutes and dizziness for several minutes but nausea persisted and headache became significantly worse.  Her vision is back to baseline now.  Dizziness has improved.  Still feeling nauseated with a headache.   Nausea and dizziness and vision changes have improved with a headache still persist.  The headache is more persistent involving the left side of her head rating down to her neck.  There is some nausea persistently but no vomiting.  No fever.  No cough, runny nose or sore throat.  No chest pain or shortness of breath.  No focal weakness, numbness or tingling. No history of migraine headaches.  The history is provided by the patient.  Headache Associated symptoms: dizziness, nausea and photophobia   Associated symptoms: no abdominal pain, no congestion, no cough, no fever, no myalgias, no vomiting and no weakness        Home Medications Prior to Admission medications   Medication Sig Start Date End  Date Taking? Authorizing Provider  albuterol  (PROVENTIL ) (2.5 MG/3ML) 0.083% nebulizer solution Take 3 mLs (2.5 mg total) by nebulization every 4 (four) hours as needed for wheezing or shortness of breath. 02/11/22   Christopher Savannah, PA-C  albuterol  (VENTOLIN  HFA) 108 (90 Base) MCG/ACT inhaler Inhale 1-2 puffs into the lungs every 6 (six) hours as needed for wheezing or shortness of breath. 02/05/23   Gretel App, NP  Apremilast  30 MG TABS Take one tablet twice daily as directed 01/04/23   Jegede, Olugbemiga E, MD  clobetasol  (TEMOVATE ) 0.05 % external solution Apply 1 Application topically 2 (two) times daily. To scalp 12/12/22   Alm Delon SAILOR, DO  Fe Bisgly-Vit C-Vit B12-FA (GENTLE IRON PO) Take 1 tablet by mouth daily.    [provider]  Fluocinolone  Acetonide Scalp (DERMA-SMOOTHE /FS SCALP) 0.01 % OIL Apply 1 Application topically as needed. Leave on overnight then shampoo hair next day 08/15/22   Jeneal Danita Macintosh, MD  fluticasone -salmeterol (ADVAIR DISKUS) 100-50 MCG/ACT AEPB INHALE 1 PUFF BY MOUTH INTO THE LUNGS 2 TIMES DAILY. 06/28/22   Gretel App, NP  KETOCONAZOLE , TOPICAL, 1 % SHAM Apply topically to scalp two times weekly. 06/28/22   Gretel App, NP  levocetirizine (XYZAL) 5 MG tablet Take 5 mg by mouth every evening.    [provider]  tacrolimus  (PROTOPIC ) 0.1 % ointment Apply topically 2 (two) times daily. Apply to affected areas of neck and under breasts twice daily x 2 weeks. After  2 weeks, apply Triamcinolone  ointment twice daily to affected areas x 2 weeks. Continue to alternate medications every 2 weeks. 12/12/22   Alm Delon SAILOR, DO  triamcinolone  ointment (KENALOG ) 0.1 % Apply 1 Application topically 2 (two) times daily. Apply to affected areas of neck and under breasts twice daily x 2 weeks. After 2 weeks, apply Tacrolimus  ointment twice daily to affected areas x 2 weeks. Continue to alternate medications every 2 weeks. 12/12/22   Alm Delon SAILOR, DO       Allergies    Marijuana (cannabis sativa) and Naproxen sodium    Review of Systems   Review of Systems  Constitutional:  Negative for activity change, appetite change and fever.  HENT:  Negative for congestion and rhinorrhea.   Eyes:  Positive for photophobia and visual disturbance.  Respiratory:  Negative for cough, chest tightness and shortness of breath.   Cardiovascular:  Negative for chest pain.  Gastrointestinal:  Positive for nausea. Negative for abdominal pain and vomiting.  Genitourinary:  Negative for dysuria and hematuria.  Musculoskeletal:  Negative for arthralgias and myalgias.  Skin:  Negative for rash.  Neurological:  Positive for dizziness, light-headedness and headaches. Negative for weakness.   all other systems are negative except as noted in the HPI and PMH.    Physical Exam Updated Vital Signs BP 126/84 (BP Location: Left Arm)   Pulse 96   Temp 97.8 F (36.6 C)   Resp 20   Ht 5' 4 (1.626 m)   Wt 105.1 kg   SpO2 98%   BMI 39.77 kg/m  Physical Exam Vitals and nursing note reviewed.  Constitutional:      General: She is not in acute distress.    Appearance: She is well-developed.  HENT:     Head: Normocephalic and atraumatic.     Mouth/Throat:     Pharynx: No oropharyngeal exudate.  Eyes:     Conjunctiva/sclera: Conjunctivae normal.     Pupils: Pupils are equal, round, and reactive to light.     Comments: No hyphema, no hypopyon, no areas of fluorescein  uptake.  Intraocular pressure 13 on the left.  Neck:     Comments: No meningismus. Cardiovascular:     Rate and Rhythm: Normal rate and regular rhythm.     Heart sounds: Normal heart sounds. No murmur heard. Pulmonary:     Effort: Pulmonary effort is normal. No respiratory distress.     Breath sounds: Normal breath sounds.  Abdominal:     Palpations: Abdomen is soft.     Tenderness: There is no abdominal tenderness. There is no guarding or rebound.  Musculoskeletal:        General: No  tenderness. Normal range of motion.     Cervical back: Normal range of motion and neck supple.  Skin:    General: Skin is warm.  Neurological:     Mental Status: She is alert and oriented to person, place, and time.     Cranial Nerves: No cranial nerve deficit.     Motor: No abnormal muscle tone.     Coordination: Coordination normal.     Comments:  5/5 strength throughout. CN 2-12 intact.Equal grip strength.   Psychiatric:        Behavior: Behavior normal.     ED Results / Procedures / Treatments   Labs (all labs ordered are listed, but only abnormal results are displayed) Labs Reviewed  CBC WITH DIFFERENTIAL/PLATELET - Abnormal; Notable for the following components:      Result  Value   WBC 10.7 (*)    Hemoglobin 11.6 (*)    HCT 35.4 (*)    Platelets 457 (*)    Lymphs Abs 4.5 (*)    Eosinophils Absolute 0.8 (*)    All other components within normal limits  BASIC METABOLIC PANEL - Abnormal; Notable for the following components:   Sodium 134 (*)    Potassium 3.2 (*)    Glucose, Bld 118 (*)    Calcium 8.6 (*)    Anion gap 4 (*)    All other components within normal limits  SEDIMENTATION RATE - Abnormal; Notable for the following components:   Sed Rate 36 (*)    All other components within normal limits  HCG, SERUM, QUALITATIVE    EKG None  Radiology CT ANGIO HEAD NECK W WO CM Result Date: 03/22/2023 CLINICAL DATA:  Severe headache with onset 5 days ago. Nausea, dizziness, blurred vision, in light sensitivity in the left eye. EXAM: CT ANGIOGRAPHY HEAD AND NECK WITH AND WITHOUT CONTRAST TECHNIQUE: Multidetector CT imaging of the head and neck was performed using the standard protocol during bolus administration of intravenous contrast. Multiplanar CT image reconstructions and MIPs were obtained to evaluate the vascular anatomy. Carotid stenosis measurements (when applicable) are obtained utilizing NASCET criteria, using the distal internal carotid diameter as the denominator.  RADIATION DOSE REDUCTION: This exam was performed according to the departmental dose-optimization program which includes automated exposure control, adjustment of the mA and/or kV according to patient size and/or use of iterative reconstruction technique. CONTRAST:  75mL OMNIPAQUE  IOHEXOL  350 MG/ML SOLN COMPARISON:  None Available. FINDINGS: CT HEAD FINDINGS Brain: No evidence of acute infarction, hemorrhage, hydrocephalus, extra-axial collection or mass lesion/mass effect. Vascular: No hyperdense vessel or unexpected calcification. Skull: Normal. Negative for fracture or focal lesion. Sinuses/Orbits: Mild mucosal thickening in the left maxillary sinus. Left maxillary antrostomy. Review of the MIP images confirms the above findings CTA NECK FINDINGS Aortic arch: Unremarkable with 3 vessel branching Right carotid system: Vessels are smoothly contoured and widely patent. Left carotid system: Vessels are smoothly contoured and widely patent. Small ridge like appearance at the posterior wall of the left ICA origin but non elongated to implicate web. Vertebral arteries: Subclavian and vertebral arteries are smoothly contoured and widely patent. Skeleton: Unremarkable Other neck: No acute finding Upper chest: Clear apical lungs Review of the MIP images confirms the above findings CTA HEAD FINDINGS Anterior circulation: No significant stenosis, proximal occlusion, aneurysm, or vascular malformation. Posterior circulation: No significant stenosis, proximal occlusion, aneurysm, or vascular malformation. Venous sinuses: As permitted by contrast timing, patent. Anatomic variants: None significant Review of the MIP images confirms the above findings IMPRESSION: Negative CTA.  No explanation for headache. Electronically Signed   By: Dorn Roulette M.D.   On: 03/22/2023 06:14    Procedures Procedures    Medications Ordered in ED Medications  fluorescein  ophthalmic strip 1 strip (has no administration in time range)   tetracaine  (PONTOCAINE) 0.5 % ophthalmic solution 2 drop (has no administration in time range)  ketorolac  (TORADOL ) 30 MG/ML injection 15 mg (has no administration in time range)  metoCLOPramide  (REGLAN ) injection 10 mg (has no administration in time range)  diphenhydrAMINE  (BENADRYL ) injection 25 mg (has no administration in time range)    ED Course/ Medical Decision Making/ A&P                                 Medical  Decision Making Amount and/or Complexity of Data Reviewed Labs: ordered. Decision-making details documented in ED Course. Radiology: ordered and independent interpretation performed. Decision-making details documented in ED Course. ECG/medicine tests: ordered and independent interpretation performed. Decision-making details documented in ED Course.  Risk Prescription drug management.   5 days of gradual onset headache, no history of migraines.  Neurological exam is nonfocal.  Low suspicion for subarachnoid hemorrhage, meningitis, temporal arteritis  Given lack of previous headache will obtain imaging.  Intraocular pressure is normal.  Neurologic exam is nonfocal.  CT head is obtained given her severe headache with lack of previous similar headache.  This is negative for hemorrhage.  Negative for aneurysm.  No explanation for headaches seen.  Recheck, patient's headache has improved.  She is tolerating p.o.  She has no meningismus.  Low suspicion for subarachnoid hemorrhage, meningitis, temporal arteritis.  Suspect likely migraine type headache.  Low suspicion for CVA or TIA.    Will refer to neurology.  Return to the ED with severe headache that is atypical for her, headache associate with unilateral weakness, numbness, tingling, confusion, visual changes, no other concerns.        Final Clinical Impression(s) / ED Diagnoses Final diagnoses:  Bad headache    Rx / DC Orders ED Discharge Orders     None         Vilma Will, Garnette, MD 03/22/23  [id number]

## 2023-03-22 NOTE — ED Notes (Signed)
 Pt passed PO challenge.

## 2023-03-22 NOTE — ED Notes (Signed)
 Pt ambulatory to restroom

## 2023-03-22 NOTE — Discharge Instructions (Signed)
 We suspect likely have a migraine headache.  Follow-up with the neurologist as well as her primary care doctor.  Keep yourself hydrated.  Return to the ED with sudden onset headache, headache associated with unilateral weakness, numbness, tingling, difficulty speaking, difficulty swallowing, or other concerns.

## 2023-03-25 ENCOUNTER — Other Ambulatory Visit: Payer: Self-pay

## 2023-03-27 ENCOUNTER — Other Ambulatory Visit: Payer: Self-pay

## 2023-03-27 NOTE — Progress Notes (Signed)
 Specialty Pharmacy Refill Coordination Note  Erin [name] is a 33 y.o. female contacted today regarding refills of specialty medication(s) Apremilast    Patient requested Delivery   Delivery date: 04/01/23   Verified address: [address]   [place] [address]   Medication will be filled on 03/29/23.

## 2023-03-28 ENCOUNTER — Other Ambulatory Visit: Payer: Self-pay

## 2023-03-28 NOTE — Progress Notes (Signed)
 Patient was contacted today and was left a voice mail regarding specialty medication, due to upcoming weather conditions medications will be filled 03/28/23 and will be delivered 03/29/23

## 2023-04-02 ENCOUNTER — Encounter: Payer: Self-pay | Admitting: Nurse Practitioner

## 2023-04-02 ENCOUNTER — Ambulatory Visit (INDEPENDENT_AMBULATORY_CARE_PROVIDER_SITE_OTHER): Payer: 59 | Admitting: Nurse Practitioner

## 2023-04-02 VITALS — BP 118/70 | HR 90 | Temp 98.3°F | Ht 64.0 in | Wt 232.2 lb

## 2023-04-02 DIAGNOSIS — J309 Allergic rhinitis, unspecified: Secondary | ICD-10-CM | POA: Diagnosis not present

## 2023-04-02 DIAGNOSIS — E282 Polycystic ovarian syndrome: Secondary | ICD-10-CM | POA: Diagnosis not present

## 2023-04-02 DIAGNOSIS — J4540 Moderate persistent asthma, uncomplicated: Secondary | ICD-10-CM | POA: Diagnosis not present

## 2023-04-02 DIAGNOSIS — L409 Psoriasis, unspecified: Secondary | ICD-10-CM

## 2023-04-02 DIAGNOSIS — D75839 Thrombocytosis, unspecified: Secondary | ICD-10-CM | POA: Diagnosis not present

## 2023-04-02 DIAGNOSIS — Z6841 Body Mass Index (BMI) 40.0 and over, adult: Secondary | ICD-10-CM

## 2023-04-02 DIAGNOSIS — G4700 Insomnia, unspecified: Secondary | ICD-10-CM | POA: Diagnosis not present

## 2023-04-02 DIAGNOSIS — F3341 Major depressive disorder, recurrent, in partial remission: Secondary | ICD-10-CM | POA: Diagnosis not present

## 2023-04-02 MED ORDER — ALBUTEROL SULFATE HFA 108 (90 BASE) MCG/ACT IN AERS: 1.0000 | INHALATION_SPRAY | Freq: Four times a day (QID) | RESPIRATORY_TRACT | 11 refills | Status: DC | PRN

## 2023-04-02 MED ORDER — FLUTICASONE-SALMETEROL 100-50 MCG/ACT IN AEPB: INHALATION_SPRAY | RESPIRATORY_TRACT | 11 refills | Status: AC

## 2023-04-02 NOTE — Assessment & Plan Note (Signed)
 Managed by Allergy/Asthma in [place]. Stable on Advair and Xyzal, with Albuterol used as needed. Refill prescriptions for Advair and Albuterol. Encouraged to follow up as scheduled.

## 2023-04-02 NOTE — Progress Notes (Signed)
 Leron Glance, NP-C Phone: [phone]  Erin [name] is a 33 y.o. female who presents today for follow up.   Discussed the use of AI scribe software for clinical note transcription with the patient, who gave verbal consent to proceed.  History of Present Illness   The patient, recently married, presents with concerns about Polycystic Ovary Syndrome (PCOS). She reports a history of long, heavy menstrual periods lasting about nine days, with severe cramping during the first three days. She also mentions a past incident of an ovarian cyst rupture, which initially was mistaken for a hernia. The patient has noticed increased facial hair growth, which she manages by plucking.  In addition to these symptoms, the patient has been struggling with weight loss, which she suspects may be related to PCOS. She has noticed an increased craving for sweets and a persistently dry mouth, which she learned could be signs of insulin resistance.  The patient also reports a recent severe headache episode, which lasted for about a week. The headache was accompanied by dizziness and blurred vision, which led to a hospital visit. A CT scan and blood work were performed, but no definitive cause was identified. The headache eventually resolved after the patient avoided screen time for a few hours.  The patient is currently on Otezla  for psoriasis, and she also has asthma and allergies, for which she takes Advair and an albuterol  inhaler. She is also seeing a therapist for mental health support. She has a history of elevated platelet counts, which have been decreasing with the use of gentle iron. She has tried magnesium supplements for sleep and memory support, but experienced stomach discomfort. She is considering trying ashwagandha as an alternative.  The patient works night shifts and has been experiencing difficulty with sleep. She has tried magnesium and melatonin supplements without success and expresses concern about  potential interactions between sleep aids and her other medications. She is considering trying Unisom or Tylenol  PM.      Social History   Tobacco Use  Smoking Status Never  Smokeless Tobacco Never    Current Outpatient Medications on File Prior to Visit  Medication Sig Dispense Refill   albuterol  (PROVENTIL ) (2.5 MG/3ML) 0.083% nebulizer solution Take 3 mLs (2.5 mg total) by nebulization every 4 (four) hours as needed for wheezing or shortness of breath. 75 mL 0   Apremilast  30 MG TABS Take one tablet twice daily as directed 60 tablet 3   clobetasol  (TEMOVATE ) 0.05 % external solution Apply 1 Application topically 2 (two) times daily. To scalp 50 mL 2   Fe Bisgly-Vit C-Vit B12-FA (GENTLE IRON PO) Take 1 tablet by mouth daily.     Fluocinolone  Acetonide Scalp (DERMA-SMOOTHE /FS SCALP) 0.01 % OIL Apply 1 Application topically as needed. Leave on overnight then shampoo hair next day 118 mL 0   levocetirizine (XYZAL) 5 MG tablet Take 5 mg by mouth every evening.     tacrolimus  (PROTOPIC ) 0.1 % ointment Apply topically 2 (two) times daily. Apply to affected areas of neck and under breasts twice daily x 2 weeks. After 2 weeks, apply Triamcinolone  ointment twice daily to affected areas x 2 weeks. Continue to alternate medications every 2 weeks. 100 g 0   triamcinolone  ointment (KENALOG ) 0.1 % Apply 1 Application topically 2 (two) times daily. Apply to affected areas of neck and under breasts twice daily x 2 weeks. After 2 weeks, apply Tacrolimus  ointment twice daily to affected areas x 2 weeks. Continue to alternate medications every 2 weeks.  80 g 2   No current facility-administered medications on file prior to visit.     ROS see history of present illness  Objective  Physical Exam Vitals:   04/02/23 1459  BP: 118/70  Pulse: 90  Temp: 98.3 F (36.8 C)  SpO2: 96%    BP Readings from Last 3 Encounters:  04/02/23 118/70  03/22/23 110/64  03/05/23 126/75   Wt Readings from Last 3  Encounters:  04/02/23 232 lb 3.2 oz (105.3 kg)  03/21/23 231 lb 11.3 oz (105.1 kg)  03/05/23 231 lb 9.6 oz (105.1 kg)    Physical Exam Constitutional:      General: She is not in acute distress.    Appearance: Normal appearance.  HENT:     Head: Normocephalic.  Cardiovascular:     Rate and Rhythm: Normal rate and regular rhythm.     Heart sounds: Normal heart sounds.  Pulmonary:     Effort: Pulmonary effort is normal.     Breath sounds: Normal breath sounds.  Skin:    General: Skin is warm and dry.  Neurological:     General: No focal deficit present.     Mental Status: She is alert.  Psychiatric:        Mood and Affect: Mood normal.        Behavior: Behavior normal.    Assessment/Plan: Please see individual problem list.  PCOS (polycystic ovarian syndrome) Assessment & Plan: Reports of heavy menstrual flow, history of ovarian cysts, and facial hair growth suggest PCOS, though there is no prior diagnosis or treatment. She currently has a Paraguard IUD. Discussed follow up with Ob-Gyn to discuss further.   Morbid obesity with BMI of 40.0-44.9, adult [facility]) Assessment & Plan: Expresses concern about weight, possibly related to suspected PCOS. Encouraged to continue food journaling and working with Allergist to determine food allergies. Counseled on decreasing carb intake and increasing protein. Discussed decreasing snacking. Encouraged to continue working on healthy diet and remaining active. Address further following OB/GYN evaluation for PCOS.    Moderate persistent asthma without complication Assessment & Plan: Managed by Allergy /Asthma in [place]. Stable on Advair and Xyzal, with Albuterol  used as needed. Refill prescriptions for Advair and Albuterol . Encouraged to follow up as scheduled.  Orders: -     Albuterol  Sulfate HFA; Inhale 1-2 puffs into the lungs every 6 (six) hours as needed for wheezing or shortness of breath.  Dispense: 18 g; Refill: 11 -      Fluticasone -Salmeterol; INHALE 1 PUFF BY MOUTH INTO THE LUNGS 2 TIMES DAILY.  Dispense: 60 each; Refill: 11  Thrombocytosis Assessment & Plan: Elevated platelets are under management with Hematology, showing improvement with oral Iron. Continue follow-up with Hematology and Iron as directed.   Insomnia, unspecified type Assessment & Plan: Reports difficulty sleeping, possibly due to work schedule. Previous trials of magnesium and melatonin were not very effective. Consider a trial of over-the-counter Unisom or antihistamine for sleep aid. We will continue to monitor.    Recurrent major depressive disorder, in partial remission [facility]) Assessment & Plan: Currently in a good place with ongoing therapy, with no need for psychiatry. Not currently on medications. She will continue journaling. Encouraged to contact if worsening symptoms, unusual behavior changes or suicidal thoughts occur. Continue with the current therapy regimen.   Psoriasis Assessment & Plan: Psoriasis remains stable on Otezla . Continue Otezla  as prescribed. Follow up with Dermatology as scheduled.    Allergic rhinitis, unspecified seasonality, unspecified trigger Assessment & Plan: Managed by Allergy [facility]  in [place]. Stable on Advair and Xyzal, with Albuterol  used as needed. Refill prescriptions for Advair and Albuterol . Encouraged to follow up as scheduled.    Return in about 6 months (around 09/30/2023) for Annual Exam.   Leron Glance, NP-C [facility]

## 2023-04-02 NOTE — Assessment & Plan Note (Signed)
 Expresses concern about weight, possibly related to suspected PCOS. Encouraged to continue food journaling and working with Allergist to determine food allergies. Counseled on decreasing carb intake and increasing protein. Discussed decreasing snacking. Encouraged to continue working on healthy diet and remaining active. Address further following OB/GYN evaluation for PCOS.

## 2023-04-02 NOTE — Assessment & Plan Note (Signed)
 Currently in a good place with ongoing therapy, with no need for psychiatry. Not currently on medications. She will continue journaling. Encouraged to contact if worsening symptoms, unusual behavior changes or suicidal thoughts occur. Continue with the current therapy regimen.

## 2023-04-02 NOTE — Assessment & Plan Note (Signed)
 Elevated platelets are under management with Hematology, showing improvement with oral Iron. Continue follow-up with Hematology and Iron as directed.

## 2023-04-03 ENCOUNTER — Encounter: Payer: Self-pay | Admitting: Nurse Practitioner

## 2023-04-03 DIAGNOSIS — E282 Polycystic ovarian syndrome: Secondary | ICD-10-CM | POA: Insufficient documentation

## 2023-04-03 DIAGNOSIS — G4700 Insomnia, unspecified: Secondary | ICD-10-CM | POA: Insufficient documentation

## 2023-04-03 DIAGNOSIS — L409 Psoriasis, unspecified: Secondary | ICD-10-CM | POA: Insufficient documentation

## 2023-04-03 NOTE — Assessment & Plan Note (Signed)
 Reports difficulty sleeping, possibly due to work schedule. Previous trials of magnesium and melatonin were not very effective. Consider a trial of over-the-counter Unisom or antihistamine for sleep aid. We will continue to monitor.

## 2023-04-03 NOTE — Assessment & Plan Note (Signed)
 Psoriasis remains stable on Otezla . Continue Otezla  as prescribed. Follow up with Dermatology as scheduled.

## 2023-04-03 NOTE — Assessment & Plan Note (Signed)
 Reports of heavy menstrual flow, history of ovarian cysts, and facial hair growth suggest PCOS, though there is no prior diagnosis or treatment. She currently has a Paraguard IUD. Discussed follow up with Ob-Gyn to discuss further.

## 2023-04-24 ENCOUNTER — Other Ambulatory Visit (HOSPITAL_COMMUNITY): Payer: Self-pay

## 2023-04-26 ENCOUNTER — Encounter (HOSPITAL_COMMUNITY): Payer: Self-pay

## 2023-04-26 ENCOUNTER — Other Ambulatory Visit (HOSPITAL_COMMUNITY): Payer: Self-pay

## 2023-04-29 ENCOUNTER — Other Ambulatory Visit (HOSPITAL_COMMUNITY): Payer: Self-pay

## 2023-05-03 ENCOUNTER — Other Ambulatory Visit: Payer: Self-pay

## 2023-06-12 ENCOUNTER — Ambulatory Visit (INDEPENDENT_AMBULATORY_CARE_PROVIDER_SITE_OTHER): Payer: 59 | Admitting: Dermatology

## 2023-06-12 ENCOUNTER — Encounter: Payer: Self-pay | Admitting: Dermatology

## 2023-06-12 VITALS — BP 128/87

## 2023-06-12 DIAGNOSIS — L853 Xerosis cutis: Secondary | ICD-10-CM

## 2023-06-12 DIAGNOSIS — L409 Psoriasis, unspecified: Secondary | ICD-10-CM | POA: Diagnosis not present

## 2023-06-12 NOTE — Patient Instructions (Addendum)
 Hello M[place],  Thank you for visiting today. Here is a summary of the key instructions:  Medications: - Continue taking Otezla as prescribed for psoriasis - Use clobetasol cream for psoriasis patches under breast for up to 10 days - Switch to tacrolimus cream after 10 days of clobetasol use - Use tacrolimus cream daily until psoriasis patches clear  Skin Care: - Try Kiehl's hydrating cleanser , [place] Pos[place] Hydrating Cleanser, or CeraVe Cream to Foam instead of current cleanser - Use Kiehl's toner every other day, preferably at night - Apply Vichy Mineral 89 hyaluronic acid after cleansing, morning and night - Continue using Neutrogena Hydro Boost moisturizer - Use spray-on sunscreen daily  Lifestyle Changes: - Wear bralettes or no bra to avoid triggering psoriasis under breast - Exfoliate skin once a week, but do not over-exfoliate  Follow-up: - Written instructions and pictures of recommended products will be provided  We look forward to seeing the positive changes in your next visit. If you have any questions or concerns before then, please do not hesitate to contact our office.  Warm regards,  Dr. [name] [facility]     Important Information  Due to recent changes in healthcare laws, you may see results of your pathology and/or laboratory studies on MyChart before the doctors have had a chance to review them. We understand that in some cases there may be results that are confusing or concerning to you. Please understand that not all results are received at the same time and often the doctors may need to interpret multiple results in order to provide you with the best plan of care or course of treatment. Therefore, we ask that you please give [place] 2 business days to thoroughly review all your results before contacting the office for clarification. Should we see a critical lab result, you will be contacted sooner.   If You Need Anything After Your Visit  If you have  any questions or concerns for your doctor, please call our main line at [phone] If no one answers, please leave a voicemail as directed and we will return your call as soon as possible. Messages left after 4 pm will be answered the following business day.   You may also send [place] a message via MyChart. We typically respond to MyChart messages within 1-2 business days.  For prescription refills, please ask your pharmacy to contact our office. Our fax number is [phone].  If you have an urgent issue when the clinic is closed that cannot wait until the next business day, you can page your doctor at the number below.    Please note that while we do our best to be available for urgent issues outside of office hours, we are not available 24/7.   If you have an urgent issue and are unable to reach [place], you may choose to seek medical care at your doctor's office, retail clinic, urgent care center, or emergency room.  If you have a medical emergency, please immediately call 911 or go to the emergency department. In the event of inclement weather, please call our main line at [phone] for an update on the status of any delays or closures.  Dermatology Medication Tips: Please keep the boxes that topical medications come in in order to help keep track of the instructions about where and how to use these. Pharmacies typically print the medication instructions only on the boxes and not directly on the medication tubes.   If your medication is too expensive, please  contact our office at [phone] or send [place] a message through MyChart.   We are unable to tell what your co-pay for medications will be in advance as this is different depending on your insurance coverage. However, we may be able to find a substitute medication at lower cost or fill out paperwork to get insurance to cover a needed medication.   If a prior authorization is required to get your medication covered by your insurance  company, please allow [place] 1-2 business days to complete this process.  Drug prices often vary depending on where the prescription is filled and some pharmacies may offer cheaper prices.  The website www.goodrx.com contains coupons for medications through different pharmacies. The prices here do not account for what the cost may be with help from insurance (it may be cheaper with your insurance), but the website can give you the price if you did not use any insurance.  - You can print the associated coupon and take it with your prescription to the pharmacy.  - You may also stop by our office during regular business hours and pick up a GoodRx coupon card.  - If you need your prescription sent electronically to a different pharmacy, notify our office through [facility] or by phone at [phone]

## 2023-06-12 NOTE — Progress Notes (Signed)
   Follow-Up Visit   Subjective  Erin [name] is a 33 y.o. female who presents for the following: Psoriasis  Patient present today for follow up visit. Patient was last evaluated on 02/12/23. At this visit patient was prescribed Otezla 30mg , clobetasol solution, TMC alternating with tacrolimus every 2 weeks. Patient reports sxs are much improved. Patient denies medication changes.  The following portions of the chart were reviewed this encounter and updated as appropriate: medications, allergies, medical history  Review of Systems:  No other skin or systemic complaints except as noted in HPI or Assessment and Plan.  Objective  Well appearing patient in no apparent distress; mood and affect are within normal limits.   A focused examination was performed of the following areas: scalp & face   Relevant exam findings are noted in the Assessment and Plan.    Assessment & Plan   PSORIASIS Exam: Well-demarcated erythematous papules/plaques with silvery scale, guttate pink scaly papules.   improved  patient denies joint pain  Psoriasis is a chronic non-curable, but treatable genetic/hereditary disease that may have other systemic features affecting other organ systems such as joints (Psoriatic Arthritis). It is associated with an increased risk of inflammatory bowel disease, heart disease, non-alcoholic fatty liver disease, and depression.  Treatments include light and laser treatments; topical medications; and systemic medications including oral and injectables.  - Assessment: Patient's psoriasis is stable on [place]. Occasional small patches under her breast that come and go, requiring topical treatment. No severe side effects from [place] reported, including no mood changes or diarrhea. Medication has not affected her asthma. Small active plaque observed under the breast on examination.  - Plan:    Continue Otezla    Use clobetasol for active plaques under breast for up to 10 days, then  switch to tacrolimus    Apply tacrolimus daily until clear    For scalp psoriasis, use prescribed medication until clear    Use topical treatments as needed, approximately a couple times a month    Avoid underwire bras; continue using bralettes or no bra    Exfoliate once a week    Follow up as needed  2. Facial Dryness - Assessment: Patient reports significant facial dryness. Current skincare regimen includes micellar water, CeraVe oil-based cleanser, glycolic acid, Neutrogena Hydro Boost, and spray-on sunscreen. Daily use of glycolic acid may be contributing to the dryness.  - Plan:    Discontinue daily use of glycolic acid    Consider switching to Kiehl's hydrating cleanser    Add Kiehl's toner (containing almond milk and polyhydroxy acid) to be used every other day, preferably at night    Incorporate Vichy Mineral 89 hyaluronic acid serum into morning and night routines    Continue using Neutrogena Hydro Boost moisturizer    Maintain use of sunscreen    Provide written instructions and product pictures     Return in about 8 months (around 02/12/2024) for psorasis.    Documentation: I have reviewed the above documentation for accuracy and completeness, and I agree with the above.  I, Shirron Marcha Solders, CMA, am acting as scribe for [employer], DO.   [name], DO

## 2023-07-04 ENCOUNTER — Telehealth: Payer: Self-pay | Admitting: Neurology

## 2023-07-04 ENCOUNTER — Ambulatory Visit: Payer: 59 | Admitting: Neurology

## 2023-07-04 NOTE — Telephone Encounter (Signed)
 [facility] cancellation

## 2023-07-18 ENCOUNTER — Other Ambulatory Visit: Payer: Self-pay

## 2023-07-19 ENCOUNTER — Other Ambulatory Visit: Payer: Self-pay

## 2023-07-19 NOTE — Progress Notes (Signed)
 Disenrolled - otezla  last filled 1.9.25 (113 days). Specialty pharmacy unsuccessful in reaching patient and patient has not followed up.

## 2023-09-03 ENCOUNTER — Inpatient Hospital Stay: Payer: 59 | Admitting: Internal Medicine

## 2023-09-03 ENCOUNTER — Inpatient Hospital Stay: Payer: 59

## 2023-10-02 ENCOUNTER — Encounter: Payer: Self-pay | Admitting: Internal Medicine

## 2023-10-02 ENCOUNTER — Inpatient Hospital Stay (HOSPITAL_BASED_OUTPATIENT_CLINIC_OR_DEPARTMENT_OTHER): Admitting: Internal Medicine

## 2023-10-02 ENCOUNTER — Inpatient Hospital Stay: Attending: Internal Medicine

## 2023-10-02 VITALS — BP 131/69 | HR 104 | Temp 98.2°F | Resp 20 | Ht 64.0 in | Wt 249.0 lb

## 2023-10-02 DIAGNOSIS — J45909 Unspecified asthma, uncomplicated: Secondary | ICD-10-CM | POA: Insufficient documentation

## 2023-10-02 DIAGNOSIS — Z8042 Family history of malignant neoplasm of prostate: Secondary | ICD-10-CM | POA: Insufficient documentation

## 2023-10-02 DIAGNOSIS — D75839 Thrombocytosis, unspecified: Secondary | ICD-10-CM | POA: Insufficient documentation

## 2023-10-02 LAB — CBC WITH DIFFERENTIAL (CANCER CENTER ONLY)
Abs Immature Granulocytes: 0.03 K/uL (ref 0.00–0.07)
Basophils Absolute: 0.1 K/uL (ref 0.0–0.1)
Basophils Relative: 1 %
Eosinophils Absolute: 0.7 K/uL — ABNORMAL HIGH (ref 0.0–0.5)
Eosinophils Relative: 7 %
HCT: 38.5 % (ref 36.0–46.0)
Hemoglobin: 12.2 g/dL (ref 12.0–15.0)
Immature Granulocytes: 0 %
Lymphocytes Relative: 33 %
Lymphs Abs: 3.1 K/uL (ref 0.7–4.0)
MCH: 26.8 pg (ref 26.0–34.0)
MCHC: 31.7 g/dL (ref 30.0–36.0)
MCV: 84.6 fL (ref 80.0–100.0)
Monocytes Absolute: 0.6 K/uL (ref 0.1–1.0)
Monocytes Relative: 7 %
Neutro Abs: 4.7 K/uL (ref 1.7–7.7)
Neutrophils Relative %: 52 %
Platelet Count: 454 K/uL — ABNORMAL HIGH (ref 150–400)
RBC: 4.55 MIL/uL (ref 3.87–5.11)
RDW: 12.9 % (ref 11.5–15.5)
WBC Count: 9.2 K/uL (ref 4.0–10.5)
nRBC: 0 % (ref 0.0–0.2)

## 2023-10-02 LAB — CMP (CANCER CENTER ONLY)
ALT: 17 U/L (ref 0–44)
AST: 20 U/L (ref 15–41)
Albumin: 4 g/dL (ref 3.5–5.0)
Alkaline Phosphatase: 81 U/L (ref 38–126)
Anion gap: 8 (ref 5–15)
BUN: 10 mg/dL (ref 6–20)
CO2: 24 mmol/L (ref 22–32)
Calcium: 8.8 mg/dL — ABNORMAL LOW (ref 8.9–10.3)
Chloride: 105 mmol/L (ref 98–111)
Creatinine: 0.71 mg/dL (ref 0.44–1.00)
GFR, Estimated: >60 mL/min (ref >60)
Glucose, Bld: 122 mg/dL — ABNORMAL HIGH (ref 70–99)
Potassium: 3.4 mmol/L — ABNORMAL LOW (ref 3.5–5.1)
Sodium: 137 mmol/L (ref 135–145)
Total Bilirubin: 0.7 mg/dL (ref 0.0–1.2)
Total Protein: 7.3 g/dL (ref 6.5–8.1)

## 2023-10-02 LAB — IRON AND TIBC
Iron: 35 ug/dL (ref 28–170)
Saturation Ratios: 12 % (ref 10.4–31.8)
TIBC: 291 ug/dL (ref 250–450)
UIBC: 256 ug/dL

## 2023-10-02 LAB — FERRITIN: Ferritin: 27 ng/mL (ref 11–307)

## 2023-10-02 NOTE — Progress Notes (Signed)
 [name] CONSULT NOTE  Patient Care Team: Gretel App, NP as PCP - General (Nurse Practitioner) Rennie Erin SAUNDERS, MD as Consulting Physician (Oncology)  CHIEF COMPLAINTS/PURPOSE OF CONSULTATION: Thrombocytosis.   HEMATOLOGY HISTORY  # THROMBOCYTOSIS [platelets/ Hb; white count]; CT/US -  HISTORY OF PRESENTING ILLNESS: Patient ambulating-independently. Alone.   Erin [name] 33 y.o.  female pleasant patient is here for results of the workup ordered for elevated platelets- Possibly related to low iron is here for a follow up.   Patient is currently on oral iron.  Tolerating well.  Menstrual cycles are not very heavy.  Not tingling or numbness or burning pain in the extremities.  Review of Systems  Constitutional:  Negative for chills, diaphoresis, fever, malaise/fatigue and weight loss.  HENT:  Negative for nosebleeds and sore throat.   Eyes:  Negative for double vision.  Respiratory:  Negative for cough, hemoptysis, sputum production, shortness of breath and wheezing.   Cardiovascular:  Negative for chest pain, palpitations, orthopnea and leg swelling.  Gastrointestinal:  Negative for abdominal pain, blood in stool, constipation, diarrhea, heartburn, melena, nausea and vomiting.  Genitourinary:  Negative for dysuria, frequency and urgency.  Musculoskeletal:  Negative for back pain and joint pain.  Skin: Negative.  Negative for itching and rash.  Neurological:  Negative for dizziness, tingling, focal weakness, weakness and headaches.  Endo/Heme/Allergies:  Does not bruise/bleed easily.  Psychiatric/Behavioral:  Negative for depression. The patient is not nervous/anxious and does not have insomnia.      MEDICAL HISTORY:  Past Medical History:  Diagnosis Date   Asthma    Depression    Miscarriage     SURGICAL HISTORY: Past Surgical History:  Procedure Laterality Date   TAB      SOCIAL HISTORY: Social History   Socioeconomic History   Marital  status: Single    Spouse name: Not on file   Number of children: Not on file   Years of education: Not on file   Highest education level: Some college, no degree  Occupational History   Not on file  Tobacco Use   Smoking status: Never   Smokeless tobacco: Never  Vaping Use   Vaping status: Never Used  Substance and Sexual Activity   Alcohol use: No   Drug use: No   Sexual activity: Yes    Birth control/protection: Implant  Other Topics Concern   Not on file  Social History Narrative   Not on file   Social Drivers of Health   Financial Resource Strain: Low Risk  (09/26/2022)   Overall Financial Resource Strain (CARDIA)    Difficulty of Paying Living Expenses: Not very hard  Food Insecurity: No Food Insecurity (09/26/2022)   Hunger Vital Sign    Worried About Running Out of Food in the Last Year: Never true    Ran Out of Food in the Last Year: Never true  Transportation Needs: No Transportation Needs (09/26/2022)   PRAPARE - Administrator, Civil Service (Medical): No    Lack of Transportation (Non-Medical): No  Physical Activity: Insufficiently Active (09/26/2022)   Exercise Vital Sign    Days of Exercise per Week: 2 days    Minutes of Exercise per Session: 60 min  Stress: Stress Concern Present (09/26/2022)   [employer] of Occupational Health - Occupational Stress Questionnaire    Feeling of Stress : To some extent  Social Connections: Moderately Isolated (09/26/2022)   Social Connection and Isolation Panel    Frequency of Communication  with Friends and Family: More than three times a week    Frequency of Social Gatherings with Friends and Family: Never    Attends Religious Services: Never    Database administrator or Organizations: No    Attends Engineer, structural: Not on file    Marital Status: Living with partner  Intimate Partner Violence: Not on file    FAMILY HISTORY: Family History  Problem Relation Age of Onset   Cancer Mother     Kidney disease Father    Heart disease Father    Hyperlipidemia Father    Hypertension Father    Miscarriages / [place] Sister    Healthy Brother    Allergic rhinitis Maternal Uncle    Asthma Maternal Uncle    Allergic rhinitis Paternal Uncle    Asthma Paternal Uncle    Prostate cancer Paternal Uncle    Early death Maternal Grandmother    Early death Maternal Grandfather    Hypertension Paternal Grandmother    Prostate cancer Paternal Grandmother    Hypertension Paternal Grandfather    Anesthesia problems Neg Hx    Hypotension Neg Hx    Malignant hyperthermia Neg Hx    Pseudochol deficiency Neg Hx    Eczema Neg Hx    Urticaria Neg Hx     ALLERGIES:  is allergic to marijuana (cannabis sativa) and naproxen sodium.  MEDICATIONS:  Current Outpatient Medications  Medication Sig Dispense Refill   albuterol  (PROVENTIL ) (2.5 MG/3ML) 0.083% nebulizer solution Take 3 mLs (2.5 mg total) by nebulization every 4 (four) hours as needed for wheezing or shortness of breath. 75 mL 0   albuterol  (VENTOLIN  HFA) 108 (90 Base) MCG/ACT inhaler Inhale 1-2 puffs into the lungs every 6 (six) hours as needed for wheezing or shortness of breath. 18 g 11   clobetasol  (TEMOVATE ) 0.05 % external solution Apply 1 Application topically 2 (two) times daily. To scalp 50 mL 2   Fe Bisgly-Vit C-Vit B12-FA (GENTLE IRON PO) Take 1 tablet by mouth daily.     Fluocinolone  Acetonide Scalp (DERMA-SMOOTHE /FS SCALP) 0.01 % OIL Apply 1 Application topically as needed. Leave on overnight then shampoo hair next day 118 mL 0   fluticasone -salmeterol (ADVAIR DISKUS) 100-50 MCG/ACT AEPB INHALE 1 PUFF BY MOUTH INTO THE LUNGS 2 TIMES DAILY. 60 each 11   levocetirizine (XYZAL) 5 MG tablet Take 5 mg by mouth every evening.     tacrolimus  (PROTOPIC ) 0.1 % ointment Apply topically 2 (two) times daily. Apply to affected areas of neck and under breasts twice daily x 2 weeks. After 2 weeks, apply Triamcinolone  ointment twice daily to  affected areas x 2 weeks. Continue to alternate medications every 2 weeks. 100 g 0   triamcinolone  ointment (KENALOG ) 0.1 % Apply 1 Application topically 2 (two) times daily. Apply to affected areas of neck and under breasts twice daily x 2 weeks. After 2 weeks, apply Tacrolimus  ointment twice daily to affected areas x 2 weeks. Continue to alternate medications every 2 weeks. 80 g 2   No current facility-administered medications for this visit.     PHYSICAL EXAMINATION:   Vitals:   10/02/23 1445  BP: 131/69  Pulse: (!) 104  Resp: 20  Temp: 98.2 F (36.8 C)  SpO2: 99%    Filed Weights   10/02/23 1445  Weight: 249 lb (112.9 kg)     Physical Exam Vitals and nursing note reviewed.  HENT:     Head: Normocephalic and atraumatic.  Mouth/Throat:     Pharynx: Oropharynx is clear.  Eyes:     Extraocular Movements: Extraocular movements intact.     Pupils: Pupils are equal, round, and reactive to light.  Cardiovascular:     Rate and Rhythm: Normal rate and regular rhythm.  Pulmonary:     Comments: Decreased breath sounds bilaterally.  Abdominal:     Palpations: Abdomen is soft.  Musculoskeletal:        General: Normal range of motion.     Cervical back: Normal range of motion.  Skin:    General: Skin is warm.  Neurological:     General: No focal deficit present.     Mental Status: She is alert and oriented to person, place, and time.  Psychiatric:        Behavior: Behavior normal.        Judgment: Judgment normal.      LABORATORY DATA:  I have reviewed the data as listed Lab Results  Component Value Date   WBC 9.2 10/02/2023   HGB 12.2 10/02/2023   HCT 38.5 10/02/2023   MCV 84.6 10/02/2023   PLT 454 (H) 10/02/2023   Recent Labs    10/15/22 1442 03/05/23 1507 03/22/23 0315 10/02/23 1504  NA 136 138 134* 137  K 4.0 3.5 3.2* 3.4*  CL 103 105 103 105  CO2 25 24 27 24   GLUCOSE 92 112* 118* 122*  BUN <5* 9 13 10   CREATININE 0.72 0.78 0.73 0.71  CALCIUM  9.0 8.9 8.6* 8.8*  GFRNONAA >60 >60 >60 >60  PROT 7.3 7.2  --  7.3  ALBUMIN 3.8 3.7  --  4.0  AST 14* 21  --  20  ALT 11 15  --  17  ALKPHOS 72 67  --  81  BILITOT 0.4 1.0  --  0.7     No results found.  ASSESSMENT & PLAN:   Thrombocytosis Thrombocytosis -UNCLEAR- Essential thrombocytosis versus reactive sec ot Iron deficiency.  Patient is asymptomatic.  Platelets consistently between 490-500. ; jak 2 BCR ABL; MPL; CALR mutation- NEGATIVE. Discussed regarding bone marrow biopsy, however this would not change the management especially given her age even this is ET.  Patient is in agreement.  Will continue monitor for now.  # Today platelets are 450s; improved.  Iron studies pending.  Continue oral gentle iron.   # Asthma- on inhalers- stable.   # DISPOSITION: # follow up in 6 months-  MD; labs- cbc/cmp; iron studies; ferritin- Dr.B         Erin JONELLE Joe, MD 10/02/2023 3:50 PM

## 2023-10-02 NOTE — Progress Notes (Signed)
 Fatigue: NO Headaches:  NO Joint pain: NO Bleeding from gums/nose:  NO

## 2023-10-02 NOTE — Assessment & Plan Note (Addendum)
 Thrombocytosis -UNCLEAR- Essential thrombocytosis versus reactive sec ot Iron deficiency.  Patient is asymptomatic.  Platelets consistently between 490-500. ; jak 2 BCR ABL; MPL; CALR mutation- NEGATIVE. Discussed regarding bone marrow biopsy, however this would not change the management especially given her age even this is ET.  Patient is in agreement.  Will continue monitor for now.  # Today platelets are 450s; improved.  Iron studies pending.  Continue oral gentle iron.   # Asthma- on inhalers- stable.   # DISPOSITION: # follow up in 6 months-  MD; labs- cbc/cmp; iron studies; ferritin- Dr.B

## 2024-01-01 ENCOUNTER — Other Ambulatory Visit: Payer: Self-pay | Admitting: Dermatology

## 2024-01-15 ENCOUNTER — Ambulatory Visit
Admission: EM | Admit: 2024-01-15 | Discharge: 2024-01-15 | Disposition: A | Discharge status: Home / Self Care | Attending: Family Medicine | Admitting: Family Medicine

## 2024-01-15 DIAGNOSIS — L249 Irritant contact dermatitis, unspecified cause: Secondary | ICD-10-CM

## 2024-01-15 DIAGNOSIS — L089 Local infection of the skin and subcutaneous tissue, unspecified: Secondary | ICD-10-CM

## 2024-01-15 MED ORDER — DOXYCYCLINE HYCLATE 100 MG PO CAPS: 100.0000 mg | ORAL_CAPSULE | Freq: Two times a day (BID) | ORAL | 0 refills | Status: DC

## 2024-01-15 MED ORDER — TRIAMCINOLONE ACETONIDE 0.1 % EX CREA: 1.0000 | TOPICAL_CREAM | Freq: Two times a day (BID) | CUTANEOUS | 0 refills | Status: AC

## 2024-01-15 NOTE — ED Provider Notes (Signed)
 [name]  Note:  This document was prepared using Conservation officer, historic buildings and may include unintentional dictation errors.  MRN: [id number] DOB: [birth year]  Subjective:   Erin [name] is a 33 y.o. female presenting for 1 day history of a left lower leg lesion.  Patient initially noticed it as an itching and burning type sensation.  That she started to scratch the area, it became more red, painful and swollen.  She has used alcohol to clean the area.  Is concerned that she may have a staph infection as she had this before.  No current facility-administered medications for this encounter.  Current Outpatient Medications:    albuterol  (PROVENTIL ) (2.5 MG/3ML) 0.083% nebulizer solution, Take 3 mLs (2.5 mg total) by nebulization every 4 (four) hours as needed for wheezing or shortness of breath., Disp: 75 mL, Rfl: 0   albuterol  (VENTOLIN  HFA) 108 (90 Base) MCG/ACT inhaler, Inhale 1-2 puffs into the lungs every 6 (six) hours as needed for wheezing or shortness of breath., Disp: 18 g, Rfl: 11   clobetasol  (TEMOVATE ) 0.05 % external solution, Apply 1 Application topically 2 (two) times daily. To scalp, Disp: 50 mL, Rfl: 0   Fe Bisgly-Vit C-Vit B12-FA (GENTLE IRON PO), Take 1 tablet by mouth daily., Disp: , Rfl:    Fluocinolone  Acetonide Scalp (DERMA-SMOOTHE /FS SCALP) 0.01 % OIL, Apply 1 Application topically as needed. Leave on overnight then shampoo hair next day, Disp: 118 mL, Rfl: 0   fluticasone -salmeterol (ADVAIR DISKUS) 100-50 MCG/ACT AEPB, INHALE 1 PUFF BY MOUTH INTO THE LUNGS 2 TIMES DAILY., Disp: 60 each, Rfl: 11   levocetirizine (XYZAL) 5 MG tablet, Take 5 mg by mouth every evening., Disp: , Rfl:    tacrolimus  (PROTOPIC ) 0.1 % ointment, Apply topically 2 (two) times daily. Apply to affected areas of neck and under breasts twice daily x 2 weeks. After 2 weeks, apply Triamcinolone  ointment twice daily to affected areas x 2 weeks. Continue to alternate  medications every 2 weeks., Disp: 100 g, Rfl: 0   triamcinolone  ointment (KENALOG ) 0.1 %, Apply 1 Application topically 2 (two) times daily. Apply to affected areas of neck and under breasts twice daily x 2 weeks. After 2 weeks, apply Tacrolimus  ointment twice daily to affected areas x 2 weeks. Continue to alternate medications every 2 weeks., Disp: 80 g, Rfl: 2   Allergies  Allergen Reactions   Marijuana (Cannabis Sativa) Anaphylaxis, Hives and Swelling    Dx in [place]  at the age of 65   Naproxen Sodium Other (See Comments)    Difficulty with imbalance;  Difficulty with imbalance;     Past Medical History:  Diagnosis Date   Asthma    Depression    Miscarriage      Past Surgical History:  Procedure Laterality Date   TAB      Family History  Problem Relation Age of Onset   Cancer Mother    Kidney disease Father    Heart disease Father    Hyperlipidemia Father    Hypertension Father    Miscarriages / Stillbirths Sister    Healthy Brother    Allergic rhinitis Maternal Uncle    Asthma Maternal Uncle    Allergic rhinitis Paternal Uncle    Asthma Paternal Uncle    Prostate cancer Paternal Uncle    Early death Maternal Grandmother    Early death Maternal Grandfather    Hypertension Paternal Grandmother    Prostate cancer Paternal Grandmother    Hypertension Paternal  Grandfather    Anesthesia problems Neg Hx    Hypotension Neg Hx    Malignant hyperthermia Neg Hx    Pseudochol deficiency Neg Hx    Eczema Neg Hx    Urticaria Neg Hx     Social History   Tobacco Use   Smoking status: Never   Smokeless tobacco: Never  Vaping Use   Vaping status: Never Used  Substance Use Topics   Alcohol use: No   Drug use: No    ROS   Objective:   Vitals: BP 134/88 (BP Location: Right Arm)   Pulse 98   Temp 99.4 F (37.4 C) (Oral)   Resp 16   SpO2 95%   Physical Exam Constitutional:      General: She is not in acute distress.    Appearance: Normal appearance. She  is well-developed. She is not ill-appearing, toxic-appearing or diaphoretic.  HENT:     Head: Normocephalic and atraumatic.     Nose: Nose normal.     Mouth/Throat:     Mouth: Mucous membranes are moist.  Eyes:     General: No scleral icterus.       Right eye: No discharge.        Left eye: No discharge.     Extraocular Movements: Extraocular movements intact.  Cardiovascular:     Rate and Rhythm: Normal rate.  Pulmonary:     Effort: Pulmonary effort is normal.  Musculoskeletal:       Legs:  Skin:    General: Skin is warm and dry.  Neurological:     General: No focal deficit present.     Mental Status: She is alert and oriented to person, place, and time.  Psychiatric:        Mood and Affect: Mood normal.        Behavior: Behavior normal.       Assessment and Plan :   PDMP not reviewed this encounter.  1. Irritant dermatitis   2. Superficial skin infection    Suspect primary problem is irritant dermatitis and therefore will use triamcinolone  cream.  Recommended also using doxycycline for secondary superficial skin infection.  Use warm compresses. Counseled patient on potential for adverse effects with medications prescribed/recommended today, ER and return-to-clinic precautions discussed, patient verbalized understanding.    Christopher Savannah, [place] 01/15/24 1942

## 2024-01-15 NOTE — Discharge Instructions (Signed)
 Use the steroid cream for the irritant caused by an unknown puncture wound. Use doxycycline to address secondary infection.

## 2024-01-15 NOTE — ED Triage Notes (Signed)
 Pt reports she felt today a hard area in the left lower leg, redness,warm,  painful today. Pt concern fro Staph infection.

## 2024-01-26 ENCOUNTER — Ambulatory Visit
Admission: EM | Admit: 2024-01-26 | Discharge: 2024-01-26 | Disposition: A | Discharge status: Home / Self Care | Attending: Family Medicine | Admitting: Family Medicine

## 2024-01-26 DIAGNOSIS — N939 Abnormal uterine and vaginal bleeding, unspecified: Secondary | ICD-10-CM

## 2024-01-26 MED ORDER — MEGESTROL ACETATE 40 MG PO TABS: 80.0000 mg | ORAL_TABLET | Freq: Every day | ORAL | 0 refills | Status: DC

## 2024-01-26 NOTE — ED Provider Notes (Signed)
 [name]  Note:  This document was prepared using Conservation officer, historic buildings and may include unintentional dictation errors.  MRN: [id number] DOB: 1990-08-25  Subjective:   Erin [name] is a 33 y.o. female presenting for 3-4 week history of vaginal bleeding. Had the IUD placed ~1 year ago. Was getting regular cycles. Generally they last a 9 day average. However, this is the longest cycle she has had. Denies fever, n/v, abdominal pain, pelvic pain, rashes, dysuria, urinary frequency, hematuria, vaginal discharge.  No concern for pregnancy.  No concern for sexually transmitted infection.  She did have some lab work done and reports that it was all normal including a CBC.  She has a remote history of having a ruptured cyst that caused a lot of pain.  Fortunately, for this particular episode of abnormal vaginal bleeding, she has no pain whatsoever.  No current facility-administered medications for this encounter.  Current Outpatient Medications:    albuterol  (PROVENTIL ) (2.5 MG/3ML) 0.083% nebulizer solution, Take 3 mLs (2.5 mg total) by nebulization every 4 (four) hours as needed for wheezing or shortness of breath., Disp: 75 mL, Rfl: 0   albuterol  (VENTOLIN  HFA) 108 (90 Base) MCG/ACT inhaler, Inhale 1-2 puffs into the lungs every 6 (six) hours as needed for wheezing or shortness of breath., Disp: 18 g, Rfl: 11   clobetasol  (TEMOVATE ) 0.05 % external solution, Apply 1 Application topically 2 (two) times daily. To scalp, Disp: 50 mL, Rfl: 0   doxycycline (VIBRAMYCIN) 100 MG capsule, Take 1 capsule (100 mg total) by mouth 2 (two) times daily., Disp: 20 capsule, Rfl: 0   Fe Bisgly-Vit C-Vit B12-FA (GENTLE IRON PO), Take 1 tablet by mouth daily., Disp: , Rfl:    Fluocinolone  Acetonide Scalp (DERMA-SMOOTHE /FS SCALP) 0.01 % OIL, Apply 1 Application topically as needed. Leave on overnight then shampoo hair next day, Disp: 118 mL, Rfl: 0   fluticasone -salmeterol (ADVAIR  DISKUS) 100-50 MCG/ACT AEPB, INHALE 1 PUFF BY MOUTH INTO THE LUNGS 2 TIMES DAILY., Disp: 60 each, Rfl: 11   levocetirizine (XYZAL) 5 MG tablet, Take 5 mg by mouth every evening., Disp: , Rfl:    tacrolimus  (PROTOPIC ) 0.1 % ointment, Apply topically 2 (two) times daily. Apply to affected areas of neck and under breasts twice daily x 2 weeks. After 2 weeks, apply Triamcinolone  ointment twice daily to affected areas x 2 weeks. Continue to alternate medications every 2 weeks., Disp: 100 g, Rfl: 0   triamcinolone  cream (KENALOG ) 0.1 %, Apply 1 Application topically 2 (two) times daily., Disp: 30 g, Rfl: 0   Allergies  Allergen Reactions   Marijuana (Cannabis Sativa) Anaphylaxis, Hives and Swelling    Dx in [place]  at the age of 7   Naproxen Sodium Other (See Comments)    Difficulty with imbalance;  Difficulty with imbalance;     Past Medical History:  Diagnosis Date   Asthma    Depression    Miscarriage      Past Surgical History:  Procedure Laterality Date   TAB      Family History  Problem Relation Age of Onset   Cancer Mother    Kidney disease Father    Heart disease Father    Hyperlipidemia Father    Hypertension Father    Miscarriages / Stillbirths Sister    Healthy Brother    Allergic rhinitis Maternal Uncle    Asthma Maternal Uncle    Allergic rhinitis Paternal Uncle    Asthma Paternal Uncle  Prostate cancer Paternal Uncle    Early death Maternal Grandmother    Early death Maternal Grandfather    Hypertension Paternal Grandmother    Prostate cancer Paternal Grandmother    Hypertension Paternal Grandfather    Anesthesia problems Neg Hx    Hypotension Neg Hx    Malignant hyperthermia Neg Hx    Pseudochol deficiency Neg Hx    Eczema Neg Hx    Urticaria Neg Hx     Social History   Tobacco Use   Smoking status: Never   Smokeless tobacco: Never  Vaping Use   Vaping status: Never Used  Substance Use Topics   Alcohol use: No   Drug use: No     ROS   Objective:   Vitals: BP 136/88 (BP Location: Left Arm)   Pulse 96   Temp 99 F (37.2 C) (Oral)   Resp 16   LMP 01/04/2024 (Exact Date)   SpO2 94%   Physical Exam Constitutional:      General: She is not in acute distress.    Appearance: Normal appearance. She is well-developed. She is not ill-appearing, toxic-appearing or diaphoretic.  HENT:     Head: Normocephalic and atraumatic.     Nose: Nose normal.     Mouth/Throat:     Mouth: Mucous membranes are moist.  Eyes:     General: No scleral icterus.       Right eye: No discharge.        Left eye: No discharge.     Extraocular Movements: Extraocular movements intact.     Conjunctiva/sclera: Conjunctivae normal.  Cardiovascular:     Rate and Rhythm: Normal rate.  Pulmonary:     Effort: Pulmonary effort is normal.  Abdominal:     General: Bowel sounds are normal. There is no distension.     Palpations: Abdomen is soft. There is no mass.     Tenderness: There is no abdominal tenderness. There is no right CVA tenderness, left CVA tenderness, guarding or rebound.  Skin:    General: Skin is warm and dry.  Neurological:     General: No focal deficit present.     Mental Status: She is alert and oriented to person, place, and time.  Psychiatric:        Mood and Affect: Mood normal.        Behavior: Behavior normal.        Thought Content: Thought content normal.        Judgment: Judgment normal.     Assessment and Plan :   PDMP not reviewed this encounter.  1. Abnormal vaginal bleeding    Offered some workup but patient declined as the testing was already normal elsewhere.  It is unlikely that she is having an active physical problem with the IUD as she is experiencing no pain.  However, I emphasized need for follow-up with her gynecologist to pursue imaging to confirm this.  Patient will undergo a trial of Megace to help with her abnormal vaginal bleeding.  Counseled patient on potential for adverse effects  with medications prescribed/recommended today, ER and return-to-clinic precautions discussed, patient verbalized understanding.    Christopher Savannah, [place] 01/26/24 [phone]

## 2024-01-26 NOTE — ED Triage Notes (Signed)
 Pt reports her menstrual period has not stopped since 01/04/24 and 4 days before she was spotting x 4 days. Denies any pain. States this is first time happened. Pt concern IUD misplaced.

## 2024-02-18 ENCOUNTER — Ambulatory Visit: Admitting: Dermatology

## 2024-02-25 ENCOUNTER — Encounter: Payer: Self-pay | Admitting: Dermatology

## 2024-03-18 ENCOUNTER — Ambulatory Visit (INDEPENDENT_AMBULATORY_CARE_PROVIDER_SITE_OTHER): Admitting: Licensed Practical Nurse

## 2024-03-18 VITALS — BP 127/86 | HR 97 | Ht 63.0 in | Wt 259.1 lb

## 2024-03-18 DIAGNOSIS — Z01411 Encounter for gynecological examination (general) (routine) with abnormal findings: Secondary | ICD-10-CM

## 2024-03-18 DIAGNOSIS — N926 Irregular menstruation, unspecified: Secondary | ICD-10-CM | POA: Diagnosis not present

## 2024-03-18 DIAGNOSIS — Z01419 Encounter for gynecological examination (general) (routine) without abnormal findings: Secondary | ICD-10-CM

## 2024-03-18 NOTE — Progress Notes (Signed)
 "    Gynecology Annual Exam   PCP: Gretel App, NP  Chief Complaint:  Chief Complaint  Patient presents with   Gynecologic Exam    History of Present Illness: Patient is a 33 y.o. H6E8978 presents for annual exam. The patient has no complaints today.  Wonders if she has PCOS, noticed it was documented  in her chart but nobody has every told her, she had US  that showed a cyst, as far she knows, she never had labs.   Has had Craving sugar  and  trouble sleeping   Vaginal dryness during sex, does not feel the same as it did before, has tried lube which helps Lost weight now has gained it back   Diagnosed with PCOS years ago,  [place]  May 2024  Bled heavy  for 23 days from Oct 18 to Nov 10, then normal Dec   LMP: Patient's last menstrual period was 02/13/2024 (exact date). Average Interval: irregular, 32-40  days Duration of flow: 9 days Heavy Menses: yes Clots: yes Intermenstrual Bleeding: no Postcoital Bleeding: no Dysmenorrhea: no  The patient is sexually active I female partner. She currently uses IUD for contraception. She denies dyspareunia.  The patient does not perform self breast exams.  There is notable family history of breast or ovarian cancer in her family. Mom Breast Cancer age late 71's   The patient wears seatbelts: yes.   The patient has regular exercise: yes.  Used to walk 1 hour day, and rehab work  The patient denies current symptoms of depression.  Went through depression after job loss, doing well now.  Admits she is under a lot of stress with her new job and her nephew recently moved in with her. Stress triggers her to over eating and poor spending habits.   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  Wears glasses for fashion,  Last eye exam 2009  Review of Systems: ROS see HPI   Past Medical History:  Patient Active Problem List   Diagnosis Date Noted Date  Diagnosed   Insomnia 04/03/2023    Psoriasis 04/03/2023    PCOS (polycystic ovarian syndrome) 04/03/2023    Thrombocytosis 10/15/2022    Allergic rhinitis 09/26/2022    Morbid obesity with BMI of 40.0-44.9, adult (HCC) 06/27/2022    Seborrheic dermatitis of scalp 06/27/2022    Encounter for general counseling and advice on contraceptive management 06/27/2022    Moderate persistent asthma without complication 06/12/2018    Major depressive disorder 06/12/2018     Past Surgical History:  Past Surgical History:  Procedure Laterality Date   TAB      Gynecologic History:  Patient's last menstrual period was 02/13/2024 (exact date). Contraception: IUD Last Pap: Results were: no abnormalities  2024  Obstetric History: [id number]  Family History:  Family History  Problem Relation Age of Onset   Cancer Mother    Kidney disease Father    Heart disease Father    Hyperlipidemia Father    Hypertension Father    Miscarriages / Stillbirths Sister    Healthy Brother    Allergic rhinitis Maternal Uncle    Asthma Maternal Uncle    Allergic rhinitis Paternal Uncle    Asthma Paternal Uncle    Prostate cancer Paternal Uncle    Early death Maternal Grandmother    Early death Maternal Grandfather    Hypertension Paternal Grandmother    Prostate cancer  Paternal Grandmother    Hypertension Paternal Grandfather    Anesthesia problems Neg Hx    Hypotension Neg Hx    Malignant hyperthermia Neg Hx    Pseudochol deficiency Neg Hx    Eczema Neg Hx    Urticaria Neg Hx     Social History:  Social History   Socioeconomic History   Marital status: Single    Spouse name: Not on file   Number of children: Not on file   Years of education: Not on file   Highest education level: Some college, no degree  Occupational History   Not on file  Tobacco Use   Smoking status: Never   Smokeless tobacco: Never  Vaping Use   Vaping status: Never Used  Substance and Sexual Activity   Alcohol use: No    Drug use: No   Sexual activity: Yes    Birth control/protection: I.U.D.  Other Topics Concern   Not on file  Social History Narrative   Not on file   Social Drivers of Health   Tobacco Use: Low Risk (01/26/2024)   Patient History    Smoking Tobacco Use: Never    Smokeless Tobacco Use: Never    Passive Exposure: Not on file  Financial Resource Strain: Low Risk (09/26/2022)   Overall Financial Resource Strain (CARDIA)    Difficulty of Paying Living Expenses: Not very hard  Food Insecurity: No Food Insecurity (09/26/2022)   Hunger Vital Sign    Worried About Running Out of Food in the Last Year: Never true    Ran Out of Food in the Last Year: Never true  Transportation Needs: No Transportation Needs (09/26/2022)   PRAPARE - Administrator, Civil Service (Medical): No    Lack of Transportation (Non-Medical): No  Physical Activity: Insufficiently Active (09/26/2022)   Exercise Vital Sign    Days of Exercise per Week: 2 days    Minutes of Exercise per Session: 60 min  Stress: Stress Concern Present (09/26/2022)   [employer] of Occupational Health - Occupational Stress Questionnaire    Feeling of Stress : To some extent  Social Connections: Moderately Isolated (09/26/2022)   Social Connection and Isolation Panel    Frequency of Communication with Friends and Family: More than three times a week    Frequency of Social Gatherings with Friends and Family: Never    Attends Religious Services: Never    Database Administrator or Organizations: No    Attends Engineer, Structural: Not on file    Marital Status: Living with partner  Intimate Partner Violence: Not on file  Depression (PHQ2-9): Low Risk (10/02/2023)   Depression (PHQ2-9)    PHQ-2 Score: 0  Alcohol Screen: Not on file  Housing: Low Risk (09/26/2022)   Housing    Last Housing Risk Score: 0  Utilities: Not on file  Health Literacy: Not on file    Allergies:  Allergies[1]  Medications: Prior to  Admission medications  Medication Sig Start Date End Date Taking? Authorizing Provider  albuterol  (PROVENTIL ) (2.5 MG/3ML) 0.083% nebulizer solution Take 3 mLs (2.5 mg total) by nebulization every 4 (four) hours as needed for wheezing or shortness of breath. 02/11/22   Christopher Savannah, PA-C  albuterol  (VENTOLIN  HFA) 108 (90 Base) MCG/ACT inhaler Inhale 1-2 puffs into the lungs every 6 (six) hours as needed for wheezing or shortness of breath. 04/02/23   Gretel App, NP  clobetasol  (TEMOVATE ) 0.05 % external solution Apply 1 Application topically 2 (two) times  daily. To scalp 01/02/24   Alm Delon SAILOR, DO  doxycycline  (VIBRAMYCIN ) 100 MG capsule Take 1 capsule (100 mg total) by mouth 2 (two) times daily. 01/15/24   Christopher Savannah, PA-C  Fe Bisgly-Vit C-Vit B12-FA (GENTLE IRON PO) Take 1 tablet by mouth daily.    [provider]  Fluocinolone  Acetonide Scalp (DERMA-SMOOTHE /FS SCALP) 0.01 % OIL Apply 1 Application topically as needed. Leave on overnight then shampoo hair next day 08/15/22   Jeneal Danita Macintosh, MD  fluticasone -salmeterol (ADVAIR DISKUS) 100-50 MCG/ACT AEPB INHALE 1 PUFF BY MOUTH INTO THE LUNGS 2 TIMES DAILY. 04/02/23   Lester, Kacy, NP  levocetirizine (XYZAL) 5 MG tablet Take 5 mg by mouth every evening.    [provider]  megestrol  (MEGACE ) 40 MG tablet Take 2 tablets (80 mg total) by mouth daily. 01/26/24   Christopher Savannah, PA-C  tacrolimus  (PROTOPIC ) 0.1 % ointment Apply topically 2 (two) times daily. Apply to affected areas of neck and under breasts twice daily x 2 weeks. After 2 weeks, apply Triamcinolone  ointment twice daily to affected areas x 2 weeks. Continue to alternate medications every 2 weeks. 12/12/22   Alm Delon SAILOR, DO  triamcinolone  cream (KENALOG ) 0.1 % Apply 1 Application topically 2 (two) times daily. 01/15/24   Christopher Savannah, PA-C    Physical Exam Vitals: There were no vitals taken for this visit.  General: NAD HEENT: normocephalic,  anicteric Thyroid: no enlargement, no palpable nodules Pulmonary: No increased work of breathing, CTAB Cardiovascular: RRR, distal pulses 2+ Breast: Breast symmetrical, no tenderness, no palpable nodules or masses, no skin or nipple retraction present, no nipple discharge.  No axillary or supraclavicular lymphadenopathy. Abdomen: NABS, soft, non-tender, non-distended.  Umbilicus without lesions.  No hepatomegaly, splenomegaly or masses palpable. No evidence of hernia  Genitourinary:  External: Normal external female genitalia.  Normal urethral meatus, normal Bartholin's and Skene's glands.    Vagina: Normal vaginal mucosa, no evidence of prolapse.  Good tone   Cervix: Grossly normal in appearance, no bleeding IUD strings visible   Uterus: Non-enlarged, mobile, normal contour.  No CMT  Adnexa: ovaries non-enlarged, no adnexal masses  Rectal: deferred  Lymphatic: no evidence of inguinal lymphadenopathy Extremities: no edema, erythema, or tenderness Neurologic: Grossly intact Psychiatric: mood appropriate, affect full   Assessment: 33 y.o. H6E8978 routine annual exam  Plan: Problem List Items Addressed This Visit   None Visit Diagnoses       Well woman exam    -  Primary   Relevant Orders   Hemoglobin A1c     Irregular periods/menstrual cycles       Relevant Orders   TSH+Prl+FSH+TestT+LH+DHEA S...       2) STI screening  wasoffered and declined  2)  ASCCP guidelines and rational discussed.  Patient opts for every 5 years screening interval  3) Contraception - the patient is currently using  IUD.  She is happy with her current form of contraception and plans to continue  4) Routine healthcare maintenance including cholesterol, diabetes screening discussed managed by PCP  5)rec seeing EAP for evaluation for mood and strategies  to decrease stress   Jinnie Cookey, CNM  [facility] 03/18/2024, 5:05 PM         [1]  Allergies Allergen Reactions   Marijuana  (Cannabis Sativa) Anaphylaxis, Hives and Swelling    Dx in [place]  at the age of 10   Naproxen Sodium Other (See Comments)    Difficulty with imbalance;  Difficulty with imbalance;    "

## 2024-03-23 LAB — TSH+PRL+FSH+TESTT+LH+DHEA S...
17-Hydroxyprogesterone: 52 ng/dL
Androstenedione: 127 ng/dL (ref 41–262)
DHEA-SO4: 124 ug/dL (ref 57.3–279.2)
FSH: 7.3 m[IU]/mL
LH: 21.2 m[IU]/mL
Prolactin: 14.8 ng/mL (ref 4.8–33.4)
TSH: 1.95 u[IU]/mL (ref 0.450–4.500)
Testosterone, Free: 1.3 pg/mL (ref 0.0–4.2)
Testosterone: 33 ng/dL (ref 8–60)

## 2024-03-23 LAB — HEMOGLOBIN A1C
Est. average glucose Bld gHb Est-mCnc: 103 mg/dL
Hgb A1c MFr Bld: 5.2 % (ref 4.8–5.6)

## 2024-03-28 ENCOUNTER — Ambulatory Visit: Payer: Self-pay | Admitting: Licensed Practical Nurse

## 2024-04-03 ENCOUNTER — Inpatient Hospital Stay: Admitting: Internal Medicine

## 2024-04-03 ENCOUNTER — Inpatient Hospital Stay: Attending: Internal Medicine

## 2024-04-03 ENCOUNTER — Encounter: Payer: Self-pay | Admitting: Internal Medicine

## 2024-04-03 VITALS — BP 120/81 | HR 90 | Temp 97.4°F | Resp 18 | Wt 258.0 lb

## 2024-04-03 DIAGNOSIS — D75839 Thrombocytosis, unspecified: Secondary | ICD-10-CM | POA: Insufficient documentation

## 2024-04-03 DIAGNOSIS — Z803 Family history of malignant neoplasm of breast: Secondary | ICD-10-CM | POA: Insufficient documentation

## 2024-04-03 DIAGNOSIS — Z8042 Family history of malignant neoplasm of prostate: Secondary | ICD-10-CM | POA: Insufficient documentation

## 2024-04-03 DIAGNOSIS — J45909 Unspecified asthma, uncomplicated: Secondary | ICD-10-CM | POA: Insufficient documentation

## 2024-04-03 LAB — IRON AND TIBC
Iron: 37 ug/dL (ref 28–170)
Saturation Ratios: 12 % (ref 10.4–31.8)
TIBC: 319 ug/dL (ref 250–450)
UIBC: 283 ug/dL

## 2024-04-03 LAB — CBC WITH DIFFERENTIAL (CANCER CENTER ONLY)
Abs Immature Granulocytes: 0.02 K/uL (ref 0.00–0.07)
Basophils Absolute: 0 K/uL (ref 0.0–0.1)
Basophils Relative: 1 %
Eosinophils Absolute: 0.8 K/uL — ABNORMAL HIGH (ref 0.0–0.5)
Eosinophils Relative: 11 %
HCT: 37.5 % (ref 36.0–46.0)
Hemoglobin: 11.9 g/dL — ABNORMAL LOW (ref 12.0–15.0)
Immature Granulocytes: 0 %
Lymphocytes Relative: 38 %
Lymphs Abs: 2.9 K/uL (ref 0.7–4.0)
MCH: 25.8 pg — ABNORMAL LOW (ref 26.0–34.0)
MCHC: 31.7 g/dL (ref 30.0–36.0)
MCV: 81.3 fL (ref 80.0–100.0)
Monocytes Absolute: 0.9 K/uL (ref 0.1–1.0)
Monocytes Relative: 11 %
Neutro Abs: 3 K/uL (ref 1.7–7.7)
Neutrophils Relative %: 39 %
Platelet Count: 431 K/uL — ABNORMAL HIGH (ref 150–400)
RBC: 4.61 MIL/uL (ref 3.87–5.11)
RDW: 13.9 % (ref 11.5–15.5)
WBC Count: 7.6 K/uL (ref 4.0–10.5)
nRBC: 0 % (ref 0.0–0.2)

## 2024-04-03 LAB — CMP (CANCER CENTER ONLY)
ALT: 14 U/L (ref 0–44)
AST: 19 U/L (ref 15–41)
Albumin: 4.3 g/dL (ref 3.5–5.0)
Alkaline Phosphatase: 99 U/L (ref 38–126)
Anion gap: 9 (ref 5–15)
BUN: 7 mg/dL (ref 6–20)
CO2: 24 mmol/L (ref 22–32)
Calcium: 9 mg/dL (ref 8.9–10.3)
Chloride: 103 mmol/L (ref 98–111)
Creatinine: 0.85 mg/dL (ref 0.44–1.00)
GFR, Estimated: >60 mL/min
Glucose, Bld: 98 mg/dL (ref 70–99)
Potassium: 4.1 mmol/L (ref 3.5–5.1)
Sodium: 136 mmol/L (ref 135–145)
Total Bilirubin: 0.3 mg/dL (ref 0.0–1.2)
Total Protein: 7.6 g/dL (ref 6.5–8.1)

## 2024-04-03 LAB — FERRITIN: Ferritin: 40 ng/mL (ref 11–307)

## 2024-04-03 NOTE — Progress Notes (Signed)
 [facility] CONSULT NOTE  Patient Care Team: Gretel App, NP as PCP - General (Nurse Practitioner) Rennie Cindy SAUNDERS, MD as Consulting Physician (Oncology)  CHIEF COMPLAINTS/PURPOSE OF CONSULTATION: Thrombocytosis.   HEMATOLOGY HISTORY  # THROMBOCYTOSIS [platelets/ Hb; white count]; CT/US -  HISTORY OF PRESENTING ILLNESS: Patient ambulating-independently. Alone.   Erin [name] 34 y.o.  female pleasant patient is here for results of the workup ordered for elevated platelets- Possibly related to low iron is here for a follow up.   Discussed the use of AI scribe software for clinical note transcription with the patient, who gave verbal consent to proceed.  History of Present Illness   Erin [name] is a 34 year old female with mild thrombocytosis and iron deficiency anemia who presents for hematology follow-up.  She has mild thrombocytosis with recent laboratory evaluation demonstrating a downward trend in platelet counts from 454,000 to 431,000, with prior values in the 500,000 range last year. She remains asymptomatic, with no bleeding, bruising, or other cytopenias.  She continues oral iron supplementation, splitting doses to minimize constipation, which has improved tolerability. Her hemoglobin is stable at 11.9 g/dL. She attributes iron deficiency to menstrual blood loss and denies symptoms of anemia such as fatigue, dyspnea, or palpitations outside of her menstrual cycle.  She reports increased use of her inhaler for asthma, which she associates with weight gain due to decreased physical activity. She attributes reduced activity to depression, which has also contributed to weight gain. She denies acute respiratory symptoms or exacerbations requiring additional intervention.  She describes significant psychosocial stress related to her father's recent diagnosis of multiple myeloma and her mother's ongoing breast cancer. She is concerned about the impact of stress on her  health but does not report any acute changes in her hematologic status.      Review of Systems  Constitutional:  Negative for chills, diaphoresis, fever, malaise/fatigue and weight loss.  HENT:  Negative for nosebleeds and sore throat.   Eyes:  Negative for double vision.  Respiratory:  Negative for cough, hemoptysis, sputum production, shortness of breath and wheezing.   Cardiovascular:  Negative for chest pain, palpitations, orthopnea and leg swelling.  Gastrointestinal:  Negative for abdominal pain, blood in stool, constipation, diarrhea, heartburn, melena, nausea and vomiting.  Genitourinary:  Negative for dysuria, frequency and urgency.  Musculoskeletal:  Negative for back pain and joint pain.  Skin: Negative.  Negative for itching and rash.  Neurological:  Negative for dizziness, tingling, focal weakness, weakness and headaches.  Endo/Heme/Allergies:  Does not bruise/bleed easily.  Psychiatric/Behavioral:  Negative for depression. The patient is not nervous/anxious and does not have insomnia.      MEDICAL HISTORY:  Past Medical History:  Diagnosis Date   Asthma    Depression    Miscarriage     SURGICAL HISTORY: Past Surgical History:  Procedure Laterality Date   TAB      SOCIAL HISTORY: Social History   Socioeconomic History   Marital status: Single    Spouse name: Not on file   Number of children: Not on file   Years of education: Not on file   Highest education level: Some college, no degree  Occupational History   Not on file  Tobacco Use   Smoking status: Never   Smokeless tobacco: Never  Vaping Use   Vaping status: Never Used  Substance and Sexual Activity   Alcohol use: No   Drug use: No   Sexual activity: Yes    Birth control/protection: I.U.D.  Other Topics Concern   Not on file  Social History Narrative   Not on file   Social Drivers of Health   Tobacco Use: Low Risk (04/03/2024)   Patient History    Smoking Tobacco Use: Never    Smokeless  Tobacco Use: Never    Passive Exposure: Not on file  Financial Resource Strain: Low Risk (09/26/2022)   Overall Financial Resource Strain (CARDIA)    Difficulty of Paying Living Expenses: Not very hard  Food Insecurity: No Food Insecurity (09/26/2022)   Hunger Vital Sign    Worried About Running Out of Food in the Last Year: Never true    Ran Out of Food in the Last Year: Never true  Transportation Needs: No Transportation Needs (09/26/2022)   PRAPARE - Administrator, Civil Service (Medical): No    Lack of Transportation (Non-Medical): No  Physical Activity: Insufficiently Active (09/26/2022)   Exercise Vital Sign    Days of Exercise per Week: 2 days    Minutes of Exercise per Session: 60 min  Stress: Stress Concern Present (09/26/2022)   [employer] of Occupational Health - Occupational Stress Questionnaire    Feeling of Stress : To some extent  Social Connections: Moderately Isolated (09/26/2022)   Social Connection and Isolation Panel    Frequency of Communication with Friends and Family: More than three times a week    Frequency of Social Gatherings with Friends and Family: Never    Attends Religious Services: Never    Database Administrator or Organizations: No    Attends Engineer, Structural: Not on file    Marital Status: Living with partner  Intimate Partner Violence: Not on file  Depression (PHQ2-9): Low Risk (10/02/2023)   Depression (PHQ2-9)    PHQ-2 Score: 0  Alcohol Screen: Not on file  Housing: Low Risk (09/26/2022)   Housing    Last Housing Risk Score: 0  Utilities: Not on file  Health Literacy: Not on file    FAMILY HISTORY: Family History  Problem Relation Age of Onset   Cancer Mother    Kidney disease Father    Heart disease Father    Hyperlipidemia Father    Hypertension Father    Miscarriages / Stillbirths Sister    Healthy Brother    Allergic rhinitis Maternal Uncle    Asthma Maternal Uncle    Allergic rhinitis Paternal Uncle     Asthma Paternal Uncle    Prostate cancer Paternal Uncle    Early death Maternal Grandmother    Early death Maternal Grandfather    Hypertension Paternal Grandmother    Prostate cancer Paternal Grandmother    Hypertension Paternal Grandfather    Anesthesia problems Neg Hx    Hypotension Neg Hx    Malignant hyperthermia Neg Hx    Pseudochol deficiency Neg Hx    Eczema Neg Hx    Urticaria Neg Hx     ALLERGIES:  is allergic to marijuana (cannabis sativa) and naproxen sodium.  MEDICATIONS:  Current Outpatient Medications  Medication Sig Dispense Refill   albuterol  (PROVENTIL ) (2.5 MG/3ML) 0.083% nebulizer solution Take 3 mLs (2.5 mg total) by nebulization every 4 (four) hours as needed for wheezing or shortness of breath. 75 mL 0   albuterol  (VENTOLIN  HFA) 108 (90 Base) MCG/ACT inhaler Inhale 1-2 puffs into the lungs every 6 (six) hours as needed for wheezing or shortness of breath. 18 g 11   clobetasol  (TEMOVATE ) 0.05 % external solution Apply 1 Application topically 2 (  two) times daily. To scalp 50 mL 0   Fe Bisgly-Vit C-Vit B12-FA (GENTLE IRON PO) Take 1 tablet by mouth daily.     Fluocinolone  Acetonide Scalp (DERMA-SMOOTHE /FS SCALP) 0.01 % OIL Apply 1 Application topically as needed. Leave on overnight then shampoo hair next day 118 mL 0   fluticasone -salmeterol (ADVAIR DISKUS) 100-50 MCG/ACT AEPB INHALE 1 PUFF BY MOUTH INTO THE LUNGS 2 TIMES DAILY. 60 each 11   levocetirizine (XYZAL) 5 MG tablet Take 5 mg by mouth every evening.     triamcinolone  cream (KENALOG ) 0.1 % Apply 1 Application topically 2 (two) times daily. 30 g 0   No current facility-administered medications for this visit.     PHYSICAL EXAMINATION:   Vitals:   04/03/24 1447  BP: 120/81  Pulse: 90  Resp: 18  Temp: (!) 97.4 F (36.3 C)    Filed Weights   04/03/24 1447  Weight: 258 lb (117 kg)     Physical Exam Vitals and nursing note reviewed.  HENT:     Head: Normocephalic and atraumatic.      Mouth/Throat:     Pharynx: Oropharynx is clear.  Eyes:     Extraocular Movements: Extraocular movements intact.     Pupils: Pupils are equal, round, and reactive to light.  Cardiovascular:     Rate and Rhythm: Normal rate and regular rhythm.  Pulmonary:     Comments: Decreased breath sounds bilaterally.  Abdominal:     Palpations: Abdomen is soft.  Musculoskeletal:        General: Normal range of motion.     Cervical back: Normal range of motion.  Skin:    General: Skin is warm.  Neurological:     General: No focal deficit present.     Mental Status: She is alert and oriented to person, place, and time.  Psychiatric:        Behavior: Behavior normal.        Judgment: Judgment normal.      LABORATORY DATA:  I have reviewed the data as listed Lab Results  Component Value Date   WBC 7.6 04/03/2024   HGB 11.9 (L) 04/03/2024   HCT 37.5 04/03/2024   MCV 81.3 04/03/2024   PLT 431 (H) 04/03/2024   Recent Labs    10/02/23 1504 04/03/24 1431  NA 137 136  K 3.4* 4.1  CL 105 103  CO2 24 24  GLUCOSE 122* 98  BUN 10 7  CREATININE 0.71 0.85  CALCIUM 8.8* 9.0  GFRNONAA >60 >60  PROT 7.3 7.6  ALBUMIN 4.0 4.3  AST 20 19  ALT 17 14  ALKPHOS 81 99  BILITOT 0.7 0.3     No results found.  ASSESSMENT & PLAN:   Thrombocytosis Thrombocytosis -UNCLEAR- Essential thrombocytosis versus reactive sec ot Iron deficiency.  Patient is asymptomatic.  Platelets consistently between 490-500. ; jak 2 BCR ABL; MPL; CALR mutation- NEGATIVE.   # Discussed regarding bone marrow biopsy, however this would not change the management especially given her age even this is ET.  Patient is in agreement.  Will continue monitor for now.  # Today platelets are 434; improved; Continue oral gentle iron.   # Asthma- on inhalers- stable.   # DISPOSITION: # follow up in 6 months-  MD; labs- cbc/cmp; iron studies; ferritin- Dr.B        Cindy JONELLE Joe, MD 04/03/2024 3:13 PM

## 2024-04-03 NOTE — Assessment & Plan Note (Signed)
 Thrombocytosis -UNCLEAR- Essential thrombocytosis versus reactive sec ot Iron deficiency.  Patient is asymptomatic.  Platelets consistently between 490-500. ; jak 2 BCR ABL; MPL; CALR mutation- NEGATIVE.   # Discussed regarding bone marrow biopsy, however this would not change the management especially given her age even this is ET.  Patient is in agreement.  Will continue monitor for now.  # Today platelets are 434; improved; Continue oral gentle iron.   # Asthma- on inhalers- stable.   # DISPOSITION: # follow up in 6 months-  MD; labs- cbc/cmp; iron studies; ferritin- Dr.B

## 2024-04-03 NOTE — Progress Notes (Signed)
 Change in her energy from weight gain. Appetite is good. Denies pain. No S/S of any clotting.Has asthma, uses inhalers.

## 2024-04-11 ENCOUNTER — Other Ambulatory Visit: Payer: Self-pay | Admitting: Nurse Practitioner

## 2024-04-11 DIAGNOSIS — J4540 Moderate persistent asthma, uncomplicated: Secondary | ICD-10-CM

## 2024-04-28 ENCOUNTER — Ambulatory Visit: Admitting: Nurse Practitioner

## 2024-10-01 ENCOUNTER — Inpatient Hospital Stay

## 2024-10-01 ENCOUNTER — Inpatient Hospital Stay: Admitting: Internal Medicine

## 2024-10-02 ENCOUNTER — Inpatient Hospital Stay: Admitting: Internal Medicine

## 2024-10-02 ENCOUNTER — Inpatient Hospital Stay
